# Patient Record
Sex: Female | Born: 1950 | Race: Black or African American | Hispanic: No | Marital: Married | Smoking: Never smoker
Health system: Southern US, Community
[De-identification: ages and names within clinical notes are randomized; demographics above are authoritative.]

## PROBLEM LIST (undated history)

## (undated) DIAGNOSIS — E119 Type 2 diabetes mellitus without complications: Secondary | ICD-10-CM

## (undated) DIAGNOSIS — E785 Hyperlipidemia, unspecified: Secondary | ICD-10-CM

## (undated) DIAGNOSIS — I10 Essential (primary) hypertension: Secondary | ICD-10-CM

## (undated) HISTORY — PX: TUBAL LIGATION: SHX77

---

## 2000-09-09 ENCOUNTER — Emergency Department (HOSPITAL_COMMUNITY): Admission: EM | Admit: 2000-09-09 | Discharge: 2000-09-09 | Payer: Self-pay | Admitting: Emergency Medicine

## 2013-04-10 ENCOUNTER — Emergency Department (HOSPITAL_BASED_OUTPATIENT_CLINIC_OR_DEPARTMENT_OTHER): Payer: BC Managed Care – PPO

## 2013-04-10 ENCOUNTER — Emergency Department (HOSPITAL_BASED_OUTPATIENT_CLINIC_OR_DEPARTMENT_OTHER)
Admission: EM | Admit: 2013-04-10 | Discharge: 2013-04-10 | Disposition: A | Discharge status: Home / Self Care | Payer: BC Managed Care – PPO | Attending: Emergency Medicine | Admitting: Emergency Medicine

## 2013-04-10 ENCOUNTER — Encounter (HOSPITAL_BASED_OUTPATIENT_CLINIC_OR_DEPARTMENT_OTHER): Payer: Self-pay | Admitting: *Deleted

## 2013-04-10 DIAGNOSIS — M7660 Achilles tendinitis, unspecified leg: Secondary | ICD-10-CM | POA: Insufficient documentation

## 2013-04-10 DIAGNOSIS — Y9239 Other specified sports and athletic area as the place of occurrence of the external cause: Secondary | ICD-10-CM | POA: Insufficient documentation

## 2013-04-10 DIAGNOSIS — Z79899 Other long term (current) drug therapy: Secondary | ICD-10-CM | POA: Insufficient documentation

## 2013-04-10 DIAGNOSIS — Z880 Allergy status to penicillin: Secondary | ICD-10-CM | POA: Insufficient documentation

## 2013-04-10 DIAGNOSIS — E119 Type 2 diabetes mellitus without complications: Secondary | ICD-10-CM | POA: Insufficient documentation

## 2013-04-10 DIAGNOSIS — X503XXA Overexertion from repetitive movements, initial encounter: Secondary | ICD-10-CM | POA: Insufficient documentation

## 2013-04-10 DIAGNOSIS — Y9341 Activity, dancing: Secondary | ICD-10-CM | POA: Insufficient documentation

## 2013-04-10 DIAGNOSIS — M654 Radial styloid tenosynovitis [de Quervain]: Secondary | ICD-10-CM | POA: Insufficient documentation

## 2013-04-10 DIAGNOSIS — M7662 Achilles tendinitis, left leg: Secondary | ICD-10-CM

## 2013-04-10 DIAGNOSIS — Y92838 Other recreation area as the place of occurrence of the external cause: Secondary | ICD-10-CM | POA: Insufficient documentation

## 2013-04-10 DIAGNOSIS — Z862 Personal history of diseases of the blood and blood-forming organs and certain disorders involving the immune mechanism: Secondary | ICD-10-CM | POA: Insufficient documentation

## 2013-04-10 DIAGNOSIS — Z794 Long term (current) use of insulin: Secondary | ICD-10-CM | POA: Insufficient documentation

## 2013-04-10 DIAGNOSIS — Z8639 Personal history of other endocrine, nutritional and metabolic disease: Secondary | ICD-10-CM | POA: Insufficient documentation

## 2013-04-10 DIAGNOSIS — I10 Essential (primary) hypertension: Secondary | ICD-10-CM | POA: Insufficient documentation

## 2013-04-10 HISTORY — DX: Hyperlipidemia, unspecified: E78.5

## 2013-04-10 HISTORY — DX: Essential (primary) hypertension: I10

## 2013-04-10 HISTORY — DX: Type 2 diabetes mellitus without complications: E11.9

## 2013-04-10 MED ORDER — HYDROCODONE-ACETAMINOPHEN 5-325 MG PO TABS: 1.0000 | ORAL_TABLET | Freq: Four times a day (QID) | ORAL | Status: DC | PRN

## 2013-04-10 MED ORDER — NAPROXEN 500 MG PO TABS: 500.0000 mg | ORAL_TABLET | Freq: Two times a day (BID) | ORAL | Status: DC

## 2013-04-10 NOTE — ED Provider Notes (Addendum)
History    CSN: [id number] Arrival date & time 04/10/13  6962  First MD Initiated Contact with Patient 04/10/13 1902     Chief Complaint  Patient presents with  . Foot Injury   (Consider location/radiation/quality/duration/timing/severity/associated sxs/prior Treatment) HPI Comments: Started having pain in the achilles after a zumba class.  Since then sx are worsening and now shooting up to her calf.  No trauma  Patient is a 62 y.o. female presenting with foot injury. The history is provided by the patient.  Foot Injury Location:  Foot Time since incident:  6 days Injury: no   Foot location:  L foot Pain details:    Quality:  Sharp, shooting and throbbing   Radiates to:  L leg   Severity:  Moderate   Onset quality:  Gradual   Timing:  Constant   Progression:  Worsening Chronicity:  New Prior injury to area:  No Relieved by:  Rest Worsened by:  Bearing weight Ineffective treatments:  NSAIDs Associated symptoms: no fever, no muscle weakness and no swelling    Past Medical History  Diagnosis Date  . Diabetes mellitus without complication   . Hypertension   . Hyperlipemia    Past Surgical History  Procedure Laterality Date  . Cesarean section    . Tubal ligation     History reviewed. No pertinent family history. History  Substance Use Topics  . Smoking status: Never Smoker   . Smokeless tobacco: Not on file  . Alcohol Use: No   OB History   Grav Para Term Preterm Abortions TAB SAB Ect Mult Living                 Review of Systems  Constitutional: Negative for fever.  All other systems reviewed and are negative.    Allergies  Penicillins  Home Medications   Current Outpatient Rx  Name  Route  Sig  Dispense  Refill  . amLODipine (NORVASC) 5 MG tablet   Oral   Take 5 mg by mouth daily.         . hydrochlorothiazide (MICROZIDE) 12.5 MG capsule   Oral   Take 12.5 mg by mouth daily.         . insulin glargine (LANTUS) 100 UNIT/ML injection  Subcutaneous   Inject 45 Units into the skin 2 (two) times daily.          BP 182/70  Pulse 78  Temp(Src) 98.8 F (37.1 C)  Resp 16  Ht 5\' 5"  (1.651 m)  Wt 180 lb (81.647 kg)  BMI 29.95 kg/m2  SpO2 100% Physical Exam  Nursing note and vitals reviewed. Constitutional: She is oriented to person, place, and time. She appears well-developed and well-nourished. No distress.  HENT:  Head: Normocephalic and atraumatic.  Eyes: EOM are normal. Pupils are equal, round, and reactive to light.  Cardiovascular: Normal rate and intact distal pulses.   Pulmonary/Chest: Effort normal.  Musculoskeletal:       Left ankle: Achilles tendon exhibits pain. Achilles tendon exhibits no defect and normal Thompson's test results.       Left foot: She exhibits tenderness and swelling. She exhibits no bony tenderness.       Feet:  Mld left calf pain.  Bilateral 1+ pitting edema in the ankle  Neurological: She is alert and oriented to person, place, and time.  Skin: Skin is warm and dry.  Psychiatric: She has a normal mood and affect. Her behavior is normal.    ED Course  Procedures (including critical care time) Labs Reviewed - No data to display US Venous Img Lower Unilateral Left  04/10/2013   *RADIOLOGY REPORT*  Clinical Data: Left foot pain and edema, history of prior DVT  LEFT LOWER EXTREMITY VENOUS DUPLEX ULTRASOUND  Technique:  Gray-scale sonography with graded compression, as well as color Doppler and duplex ultrasound were performed to evaluate the deep venous system of the lower extremity from the level of the common femoral vein through the popliteal and proximal calf veins. Spectral Doppler was utilized to evaluate flow at rest and with distal augmentation maneuvers.  Comparison:  None.  Findings:  Normal compressibility of the common femoral, superficial femoral, and popliteal veins is demonstrated, as well as the visualized proximal calf veins.  No filling defects to suggest DVT on grayscale  or color Doppler imaging.  Doppler waveforms show normal direction of venous flow, normal respiratory phasicity and response to augmentation. Great saphenous vein is patent and compressible.  No superficial thrombophlebitis.  IMPRESSION: No evidence of lower extremity deep vein thrombosis.   Original Report Authenticated By: Malachy Moan, M.D.   1. Achilles tendinitis, left     MDM    Patient presenting due to 6 days of worsening left at least tenderness with pain shooting up into her left calf. She states it started after an exercise group. However she has a prior history of TTP was concern for a blood clot. She denies any prolonged immobilization is currently not on any blood thinners.  Patient has pain and mild swelling over the left at least tendon. She has no sign of tendon rupture and feel most likely she has tendinitis. However given her history we'll do an ultrasound to rule out clot. No history or findings concerning for fracture.  U/s neg for DVT.  Pt given pain control and anti-inflammatory. Prior to d/c pt also c/o of right thumb and wrist pain that has been off and on for awhile and suggest de Quervain's tenosynovitis. Patient's types all day at work in pain is consistent with this. Patient placed in a Velcro wrist splint for comfort and to followup with above recommendations.  Gwyneth Sprout, MD 04/10/13 3086  Gwyneth Sprout, MD 04/16/13 1530

## 2013-04-10 NOTE — ED Notes (Signed)
Pt c/o left foot injury x 6 days ago

## 2013-04-10 NOTE — ED Notes (Signed)
Patient back from Ultrasound.

## 2013-09-19 ENCOUNTER — Encounter (HOSPITAL_BASED_OUTPATIENT_CLINIC_OR_DEPARTMENT_OTHER): Payer: Self-pay | Admitting: Emergency Medicine

## 2013-09-19 ENCOUNTER — Emergency Department (HOSPITAL_BASED_OUTPATIENT_CLINIC_OR_DEPARTMENT_OTHER): Payer: BC Managed Care – PPO

## 2013-09-19 ENCOUNTER — Emergency Department (HOSPITAL_BASED_OUTPATIENT_CLINIC_OR_DEPARTMENT_OTHER)
Admission: EM | Admit: 2013-09-19 | Discharge: 2013-09-19 | Disposition: A | Discharge status: Home / Self Care | Payer: BC Managed Care – PPO | Attending: Emergency Medicine | Admitting: Emergency Medicine

## 2013-09-19 DIAGNOSIS — Z79899 Other long term (current) drug therapy: Secondary | ICD-10-CM | POA: Insufficient documentation

## 2013-09-19 DIAGNOSIS — Z794 Long term (current) use of insulin: Secondary | ICD-10-CM | POA: Insufficient documentation

## 2013-09-19 DIAGNOSIS — R079 Chest pain, unspecified: Secondary | ICD-10-CM

## 2013-09-19 DIAGNOSIS — K219 Gastro-esophageal reflux disease without esophagitis: Secondary | ICD-10-CM | POA: Insufficient documentation

## 2013-09-19 DIAGNOSIS — I10 Essential (primary) hypertension: Secondary | ICD-10-CM | POA: Insufficient documentation

## 2013-09-19 DIAGNOSIS — Z791 Long term (current) use of non-steroidal anti-inflammatories (NSAID): Secondary | ICD-10-CM | POA: Insufficient documentation

## 2013-09-19 DIAGNOSIS — E119 Type 2 diabetes mellitus without complications: Secondary | ICD-10-CM | POA: Insufficient documentation

## 2013-09-19 LAB — COMPREHENSIVE METABOLIC PANEL
AST: 16 U/L (ref 0–37)
BUN: 25 mg/dL — ABNORMAL HIGH (ref 6–23)
CO2: 27 mEq/L (ref 19–32)
Calcium: 9.8 mg/dL (ref 8.4–10.5)
Creatinine, Ser: 1.2 mg/dL — ABNORMAL HIGH (ref 0.50–1.10)
GFR calc non Af Amer: 47 mL/min — ABNORMAL LOW (ref >90)

## 2013-09-19 LAB — CBC WITH DIFFERENTIAL/PLATELET
Basophils Absolute: 0 10*3/uL (ref 0.0–0.1)
Basophils Relative: 0 % (ref 0–1)
Eosinophils Relative: 2 % (ref 0–5)
HCT: 35.7 % — ABNORMAL LOW (ref 36.0–46.0)
Lymphocytes Relative: 39 % (ref 12–46)
MCHC: 33.3 g/dL (ref 30.0–36.0)
MCV: 84.2 fL (ref 78.0–100.0)
Monocytes Absolute: 0.4 10*3/uL (ref 0.1–1.0)
Monocytes Relative: 6 % (ref 3–12)
RDW: 12.9 % (ref 11.5–15.5)

## 2013-09-19 LAB — TROPONIN I: Troponin I: <0.3 ng/mL (ref <0.30)

## 2013-09-19 LAB — LIPASE, BLOOD: Lipase: 47 U/L (ref 11–59)

## 2013-09-19 MED ORDER — ONDANSETRON HCL 4 MG/2ML IJ SOLN
4.0000 mg | Freq: Once | INTRAMUSCULAR | Status: AC
  Administered 2013-09-19: 4 mg via INTRAVENOUS
  Filled 2013-09-19: qty 2

## 2013-09-19 MED ORDER — PANTOPRAZOLE SODIUM 20 MG PO TBEC: 20.0000 mg | DELAYED_RELEASE_TABLET | Freq: Every day | ORAL | Status: DC

## 2013-09-19 MED ORDER — PANTOPRAZOLE SODIUM 40 MG IV SOLR
40.0000 mg | Freq: Once | INTRAVENOUS | Status: AC
  Administered 2013-09-19: 40 mg via INTRAVENOUS
  Filled 2013-09-19: qty 40

## 2013-09-19 MED ORDER — HYDROCODONE-ACETAMINOPHEN 5-325 MG PO TABS: 1.0000 | ORAL_TABLET | ORAL | Status: DC | PRN

## 2013-09-19 MED ORDER — SODIUM CHLORIDE 0.9 % IV BOLUS (SEPSIS)
500.0000 mL | Freq: Once | INTRAVENOUS | Status: AC
  Administered 2013-09-19: 500 mL via INTRAVENOUS

## 2013-09-19 MED ORDER — MORPHINE SULFATE 4 MG/ML IJ SOLN
4.0000 mg | Freq: Once | INTRAMUSCULAR | Status: AC
  Administered 2013-09-19: 4 mg via INTRAVENOUS
  Filled 2013-09-19: qty 1

## 2013-09-19 MED ORDER — GI COCKTAIL ~~LOC~~
30.0000 mL | Freq: Once | ORAL | Status: AC
  Administered 2013-09-19: 30 mL via ORAL
  Filled 2013-09-19: qty 30

## 2013-09-19 NOTE — ED Provider Notes (Signed)
Medical screening examination/treatment/procedure(s) were performed by non-physician practitioner and as supervising physician I was immediately available for consultation/collaboration.  EKG Interpretation    Date/Time:  Friday September 19 2013 17:58:36 EST Ventricular Rate:  71 PR Interval:  156 QRS Duration: 88 QT Interval:  382 QTC Calculation: 415 R Axis:   36 Text Interpretation:  Normal sinus rhythm Nonspecific T wave abnormality Abnormal ECG since last tracing no significant change Confirmed by Violia Knopf  MD, Allyiah Gartner ([id number]) on 09/19/2013 6:06:51 PM              Rolan Bucco, MD 09/19/13 2240

## 2013-09-19 NOTE — ED Notes (Signed)
Pt. Reports she has an ulcer and this pain feels like the ulcer pain she has had in past.

## 2013-09-19 NOTE — ED Notes (Signed)
Chest pain x 3 days. Sternal pain that feels like heartburn. No relief with Zantac.

## 2013-09-19 NOTE — ED Notes (Signed)
Pt. Reports she is taking PCN for a tooth abcess at present time.

## 2013-09-19 NOTE — ED Notes (Signed)
Family at bedside. 

## 2013-09-19 NOTE — ED Provider Notes (Signed)
CSN: [id number]     Arrival date & time 09/19/13  1751 History   None    Chief Complaint  Patient presents with  . Chest Pain   (Consider location/radiation/quality/duration/timing/severity/associated sxs/prior Treatment) HPI Comments: Pt states that she has had chest pain/epigastric pain times 3 days:no sob, diaphoresis:vomiting at time of onset:no fever, cough:pt states that she has a history of ulcer and it feels similar but zantac was not helping:pt state that she had a unremarkable cath in 2011:nothing makes the pain better or worse  The history is provided by the patient. No language interpreter was used.    Past Medical History  Diagnosis Date  . Diabetes mellitus without complication   . Hypertension   . Hyperlipemia    Past Surgical History  Procedure Laterality Date  . Cesarean section    . Tubal ligation     No family history on file. History  Substance Use Topics  . Smoking status: Never Smoker   . Smokeless tobacco: Not on file  . Alcohol Use: No   OB History   Grav Para Term Preterm Abortions TAB SAB Ect Mult Living                 Review of Systems  Constitutional: Negative.   Respiratory: Negative.  Negative for shortness of breath.   Cardiovascular: Positive for chest pain.    Allergies  Review of patient's allergies indicates no active allergies.  Home Medications   Current Outpatient Rx  Name  Route  Sig  Dispense  Refill  . PENICILLIN V POTASSIUM PO   Oral   Take by mouth.         Marland Kitchen amLODipine (NORVASC) 5 MG tablet   Oral   Take 5 mg by mouth daily.         . hydrochlorothiazide (MICROZIDE) 12.5 MG capsule   Oral   Take 12.5 mg by mouth daily.         Marland Kitchen HYDROcodone-acetaminophen (NORCO/VICODIN) 5-325 MG per tablet   Oral   Take 1 tablet by mouth every 6 (six) hours as needed for pain.   15 tablet   0   . insulin glargine (LANTUS) 100 UNIT/ML injection   Subcutaneous   Inject 45 Units into the skin 2 (two) times daily.          . naproxen (NAPROSYN) 500 MG tablet   Oral   Take 1 tablet (500 mg total) by mouth 2 (two) times daily.   30 tablet   0    BP 186/86  Pulse 73  Temp(Src) 98.6 F (37 C) (Oral)  Resp 18  Ht 5' 5.5" (1.664 m)  Wt 180 lb (81.647 kg)  BMI 29.49 kg/m2  SpO2 100% Physical Exam  Nursing note and vitals reviewed. Constitutional: She is oriented to person, place, and time. She appears well-developed and well-nourished.  HENT:  Head: Normocephalic and atraumatic.  Eyes: Conjunctivae and EOM are normal.  Cardiovascular: Normal rate and regular rhythm.   Pulmonary/Chest: Effort normal and breath sounds normal.  Abdominal: Soft. Bowel sounds are normal. There is tenderness in the epigastric area.  Musculoskeletal: Normal range of motion.  Neurological: She is alert and oriented to person, place, and time.  Skin: Skin is warm and dry.    ED Course  Procedures (including critical care time) Labs Review Labs Reviewed  CBC WITH DIFFERENTIAL - Abnormal; Notable for the following:    Hemoglobin 11.9 (*)    HCT 35.7 (*)  All other components within normal limits  COMPREHENSIVE METABOLIC PANEL - Abnormal; Notable for the following:    Glucose, Bld 112 (*)    BUN 25 (*)    Creatinine, Ser 1.20 (*)    GFR calc non Af Amer 47 (*)    GFR calc Af Amer 55 (*)    All other components within normal limits  TROPONIN I  LIPASE, BLOOD   Imaging Review Dg Chest 2 View  09/19/2013   CLINICAL DATA:  Chest pain for 3 days.  EXAM: CHEST  2 VIEW  COMPARISON:  None.  FINDINGS: The lungs are well-aerated and clear. There is no evidence of focal opacification, pleural effusion or pneumothorax.  The heart is normal in size; the mediastinal contour is within normal limits. No acute osseous abnormalities are seen.  IMPRESSION: No acute cardiopulmonary process seen.   Electronically Signed   By: Roanna Raider M.D.   On: 09/19/2013 21:39    EKG Interpretation    Date/Time:  Friday September 19 2013 17:58:36 EST Ventricular Rate:  71 PR Interval:  156 QRS Duration: 88 QT Interval:  382 QTC Calculation: 415 R Axis:   36 Text Interpretation:  Normal sinus rhythm Nonspecific T wave abnormality Abnormal ECG since last tracing no significant change Confirmed by BELFI  MD, MELANIE ([id number]) on 09/19/2013 6:06:51 PM            MDM   1. GERD (gastroesophageal reflux disease)   2. Chest pain    Pt symptoms have resolved:obtained [facility]-no significant disease noted, it was done 03/2010:doubt acs:pt state that she started victoza recently and that seems about when the symptoms started:discussed with pt that it may be a side effect:pt to follow up with pcp; sent home on protonix an vicodin    Teressa Lower, NP 09/19/13 2200

## 2013-09-20 ENCOUNTER — Emergency Department (HOSPITAL_BASED_OUTPATIENT_CLINIC_OR_DEPARTMENT_OTHER)
Admission: RE | Admit: 2013-09-20 | Discharge: 2013-09-20 | Disposition: A | Discharge status: Home / Self Care | Payer: BC Managed Care – PPO | Source: Ambulatory Visit | Attending: Emergency Medicine | Admitting: Emergency Medicine

## 2013-09-20 ENCOUNTER — Other Ambulatory Visit (HOSPITAL_BASED_OUTPATIENT_CLINIC_OR_DEPARTMENT_OTHER): Payer: Self-pay | Admitting: Emergency Medicine

## 2013-09-20 ENCOUNTER — Emergency Department (HOSPITAL_BASED_OUTPATIENT_CLINIC_OR_DEPARTMENT_OTHER): Admission: RE | Admit: 2013-09-20 | Payer: BC Managed Care – PPO | Source: Ambulatory Visit

## 2013-09-20 DIAGNOSIS — R079 Chest pain, unspecified: Secondary | ICD-10-CM

## 2014-11-04 ENCOUNTER — Encounter (HOSPITAL_BASED_OUTPATIENT_CLINIC_OR_DEPARTMENT_OTHER): Payer: Self-pay | Admitting: *Deleted

## 2014-11-04 ENCOUNTER — Emergency Department (HOSPITAL_BASED_OUTPATIENT_CLINIC_OR_DEPARTMENT_OTHER)
Admission: EM | Admit: 2014-11-04 | Discharge: 2014-11-04 | Disposition: A | Discharge status: Home / Self Care | Payer: BLUE CROSS/BLUE SHIELD | Attending: Emergency Medicine | Admitting: Emergency Medicine

## 2014-11-04 DIAGNOSIS — E785 Hyperlipidemia, unspecified: Secondary | ICD-10-CM | POA: Insufficient documentation

## 2014-11-04 DIAGNOSIS — S39012A Strain of muscle, fascia and tendon of lower back, initial encounter: Secondary | ICD-10-CM

## 2014-11-04 DIAGNOSIS — X58XXXA Exposure to other specified factors, initial encounter: Secondary | ICD-10-CM | POA: Diagnosis not present

## 2014-11-04 DIAGNOSIS — Z794 Long term (current) use of insulin: Secondary | ICD-10-CM | POA: Insufficient documentation

## 2014-11-04 DIAGNOSIS — Z79899 Other long term (current) drug therapy: Secondary | ICD-10-CM | POA: Diagnosis not present

## 2014-11-04 DIAGNOSIS — I10 Essential (primary) hypertension: Secondary | ICD-10-CM | POA: Insufficient documentation

## 2014-11-04 DIAGNOSIS — Y9389 Activity, other specified: Secondary | ICD-10-CM | POA: Insufficient documentation

## 2014-11-04 DIAGNOSIS — Y9289 Other specified places as the place of occurrence of the external cause: Secondary | ICD-10-CM | POA: Insufficient documentation

## 2014-11-04 DIAGNOSIS — Z791 Long term (current) use of non-steroidal anti-inflammatories (NSAID): Secondary | ICD-10-CM | POA: Insufficient documentation

## 2014-11-04 DIAGNOSIS — E119 Type 2 diabetes mellitus without complications: Secondary | ICD-10-CM | POA: Diagnosis not present

## 2014-11-04 DIAGNOSIS — S3992XA Unspecified injury of lower back, initial encounter: Secondary | ICD-10-CM | POA: Diagnosis present

## 2014-11-04 DIAGNOSIS — Y998 Other external cause status: Secondary | ICD-10-CM | POA: Insufficient documentation

## 2014-11-04 MED ORDER — CYCLOBENZAPRINE HCL 5 MG PO TABS: 5.0000 mg | ORAL_TABLET | Freq: Two times a day (BID) | ORAL | Status: DC | PRN

## 2014-11-04 MED ORDER — AMLODIPINE BESYLATE 10 MG PO TABS: 5.0000 mg | ORAL_TABLET | Freq: Every day | ORAL | Status: AC

## 2014-11-04 MED ORDER — LISINOPRIL 20 MG PO TABS: 20.0000 mg | ORAL_TABLET | Freq: Every day | ORAL | Status: DC

## 2014-11-04 MED ORDER — OXYCODONE HCL 5 MG PO TABS: 2.5000 mg | ORAL_TABLET | Freq: Four times a day (QID) | ORAL | Status: DC | PRN

## 2014-11-04 NOTE — ED Provider Notes (Signed)
CSN: [id number]     Arrival date & time 11/04/14  1034 History   First MD Initiated Contact with Patient 11/04/14 1203     Chief Complaint  Patient presents with  . Back Pain     (Consider location/radiation/quality/duration/timing/severity/associated sxs/prior Treatment) HPI  Pt presents to ED today c/o back pain x2 days. She states the day prior to onset she was taking down her Christmas tree with moderate lifting and bending. The pain is in the lower right back. She describes the pain as throbbing. The pain worsens with movement and laying supine, it is improved by sitting. She took naproxen and Tylenol with some relief, from 10/10 to 8/10. She denies any paresthesias, radiation to legs or upper back, loss of control of bowel or bladder, loss of balance, or gait problems. The patient also reports having been out of refills for amlodipine and Lisinopril x1wk due to issue with PCP and pharmacy. She endorses taking all other medications as prescribed.  Past Medical History  Diagnosis Date  . Diabetes mellitus without complication   . Hypertension   . Hyperlipemia    Past Surgical History  Procedure Laterality Date  . Cesarean section    . Tubal ligation     No family history on file. History  Substance Use Topics  . Smoking status: Never Smoker   . Smokeless tobacco: Not on file  . Alcohol Use: No   OB History    No data available     Review of Systems  10 Systems reviewed and are negative for acute change except as noted in the HPI.     Allergies  Codeine  Home Medications   Prior to Admission medications   Medication Sig Start Date End Date Taking? Authorizing Provider  atorvastatin (LIPITOR) 20 MG tablet Take 20 mg by mouth daily.   Yes Historical Provider, MD  insulin aspart (NOVOLOG) 100 UNIT/ML injection Inject into the skin 3 (three) times daily before meals.   Yes Historical Provider, MD  lisinopril (PRINIVIL,ZESTRIL) 20 MG tablet Take 20 mg by mouth daily.    Yes Historical Provider, MD  niacin 100 MG tablet Take 100 mg by mouth at bedtime.   Yes Historical Provider, MD  amLODipine (NORVASC) 10 MG tablet Take 0.5 tablets (5 mg total) by mouth daily. 11/04/14   Josedejesus Marcum Irine Seal, PA-C  amLODipine (NORVASC) 5 MG tablet Take 5 mg by mouth daily.    Historical Provider, MD  cyclobenzaprine (FLEXERIL) 5 MG tablet Take 1 tablet (5 mg total) by mouth 2 (two) times daily as needed for muscle spasms. 11/04/14   Moriyah Byington Irine Seal, PA-C  hydrochlorothiazide (MICROZIDE) 12.5 MG capsule Take 12.5 mg by mouth daily.    Historical Provider, MD  HYDROcodone-acetaminophen (NORCO/VICODIN) 5-325 MG per tablet Take 1 tablet by mouth every 6 (six) hours as needed for pain. 04/10/13   Gwyneth Sprout, MD  HYDROcodone-acetaminophen (NORCO/VICODIN) 5-325 MG per tablet Take 1-2 tablets by mouth every 4 (four) hours as needed. 09/19/13   Teressa Lower, NP  insulin glargine (LANTUS) 100 UNIT/ML injection Inject 45 Units into the skin 2 (two) times daily.    Historical Provider, MD  lisinopril (PRINIVIL,ZESTRIL) 20 MG tablet Take 1 tablet (20 mg total) by mouth daily. 11/04/14   Brigitta Pricer Irine Seal, PA-C  naproxen (NAPROSYN) 500 MG tablet Take 1 tablet (500 mg total) by mouth 2 (two) times daily. 04/10/13   Gwyneth Sprout, MD  oxycodone (OXY IR/ROXICODONE) 5 MG immediate release tablet Take 0.5-1 tablets (2.5-5  mg total) by mouth every 6 (six) hours as needed for pain. 11/04/14   Kalianna Verbeke Irine SealG Preslea Rhodus, PA-C  pantoprazole (PROTONIX) 20 MG tablet Take 1 tablet (20 mg total) by mouth daily. 09/19/13   Teressa LowerVrinda Pickering, NP  PENICILLIN V POTASSIUM PO Take by mouth.    Historical Provider, MD   BP 192/71 mmHg  Pulse 97  Temp(Src) 98.2 F (36.8 C) (Oral)  Resp 18  Ht 5' 5.5" (1.664 m)  Wt 185 lb (83.915 kg)  BMI 30.31 kg/m2  SpO2 100% Physical Exam  Constitutional: She appears well-developed and well-nourished. No distress.  HENT:  Head: Normocephalic and atraumatic.  Eyes: Pupils are  equal, round, and reactive to light.  Neck: Normal range of motion. Neck supple.  Cardiovascular: Normal rate and regular rhythm.   Pulmonary/Chest: Effort normal.  Abdominal: Soft.  Musculoskeletal:       Back:  Pt has equal strength to bilateral lower extremities.  Neurosensory function adequate to both legs No clonus on dorsiflextion Skin color is normal. Skin is warm and moist.  I see no step off deformity, no midline bony tenderness.  Pt is able to ambulate.  No crepitus, laceration, effusion, induration, lesions, swelling.   Pedal pulses are symmetrical and palpable bilaterally  moderate tenderness to palpation of paraspinel muscles   Neurological: She is alert.  Skin: Skin is warm and dry.  Nursing note and vitals reviewed.   ED Course  Procedures (including critical care time) Labs Review Labs Reviewed - No data to display  Imaging Review No results found.   EKG Interpretation None      MDM   Final diagnoses:  Low back strain, initial encounter   Pt advised not to use NSAIDs oxycodone (OXY IR/ROXICODONE) 5 MG immediate release tablet Take 0.5-1 tablets (2.5-5 mg total) by mouth every 6 (six) hours as needed for pain. 6 tablet Dorthula Matasiffany G Dawnisha Marquina, PA-C    lisinopril (PRINIVIL,ZESTRIL) 20 MG tablet Take 1 tablet (20 mg total) by mouth daily. 8 tablet Dorthula Matasiffany G Jamekia Gannett, PA-C     64 y.o.Kiara Barrett's  with back pain. No neurological deficits and normal neuro exam. Patient can walk. No loss of bowel or bladder control. No concern for cauda equina at this time base on HPI and physical exam findings. No fever, night sweats, weight loss, h/o cancer, IVDU.   RICE protocol and pain medicine indicated and discussed with patient.   Patient Plan 1. Medications: pain medication and muscle relaxer. Cont usual home medications unless otherwise directed. 2. Treatment: rest, drink plenty of fluids, gentle stretching as discussed, alternate ice and heat  3. Follow Up:  Please followup with your primary doctor for discussion of your diagnoses and further evaluation after today's visit; if you do not have a primary care doctor use the resource guide provided to find one  Advised to follow-up with the orthopedist if symptoms do not start to resolve in the next 2-3 days. If develop loss of bowel or urinary control return to the ED as soon as possible for further evaluation. To take the medications as prescribed as they can cause harm if not taken appropriately.   Vital signs are stable at discharge. Filed Vitals:   11/04/14 1042  BP: 192/71  Pulse: 97  Temp: 98.2 F (36.8 C)  Resp: 18    Patient/guardian has voiced understanding and agreed to follow-up with the PCP or specialist.         Dorthula Matasiffany G Mikell Camp, PA-C 11/04/14 1311  Rolan BuccoMelanie Belfi,  MD 11/04/14 1433

## 2014-11-04 NOTE — ED Notes (Addendum)
Pt reports trying heat on the back and taking tylenol and naproxen but no relief.   Reports only taking 1 of the antihypertensive meds this morning. She is out of the amlodipine.

## 2014-11-04 NOTE — ED Notes (Signed)
PA at bedside.

## 2014-11-04 NOTE — Discharge Instructions (Signed)
Muscle Strain °A muscle strain is an injury that occurs when a muscle is stretched beyond its normal length. Usually a small number of muscle fibers are torn when this happens. Muscle strain is rated in degrees. First-degree strains have the least amount of muscle fiber tearing and pain. Second-degree and third-degree strains have increasingly more tearing and pain.  °Usually, recovery from muscle strain takes 1-2 weeks. Complete healing takes 5-6 weeks.  °CAUSES  °Muscle strain happens when a sudden, violent force placed on a muscle stretches it too far. This may occur with lifting, sports, or a fall.  °RISK FACTORS °Muscle strain is especially common in athletes.  °SIGNS AND SYMPTOMS °At the site of the muscle strain, there may be: °· Pain. °· Bruising. °· Swelling. °· Difficulty using the muscle due to pain or lack of normal function. °DIAGNOSIS  °Your health care provider will perform a physical exam and ask about your medical history. °TREATMENT  °Often, the best treatment for a muscle strain is resting, icing, and applying cold compresses to the injured area.   °HOME CARE INSTRUCTIONS  °· Use the PRICE method of treatment to promote muscle healing during the first 2-3 days after your injury. The PRICE method involves: °¨ Protecting the muscle from being injured again. °¨ Restricting your activity and resting the injured body part. °¨ Icing your injury. To do this, put ice in a plastic bag. Place a towel between your skin and the bag. Then, apply the ice and leave it on from 15-20 minutes each hour. After the third day, switch to moist heat packs. °¨ Apply compression to the injured area with a splint or elastic bandage. Be careful not to wrap it too tightly. This may interfere with blood circulation or increase swelling. °¨ Elevate the injured body part above the level of your heart as often as you can. °· Only take over-the-counter or prescription medicines for pain, discomfort, or fever as directed by your  health care provider. °· Warming up prior to exercise helps to prevent future muscle strains. °SEEK MEDICAL CARE IF:  °· You have increasing pain or swelling in the injured area. °· You have numbness, tingling, or a significant loss of strength in the injured area. °MAKE SURE YOU:  °· Understand these instructions. °· Will watch your condition. °· Will get help right away if you are not doing well or get worse. °Document Released: 10/02/2005 Document Revised: 07/23/2013 Document Reviewed: 05/01/2013 °[employer]® Patient Information ©2015 [employer]. This information is not intended to replace advice given to you by your health care provider. Make sure you discuss any questions you have with your health care provider. ° °Lumbosacral Strain °Lumbosacral strain is a strain of any of the parts that make up your lumbosacral vertebrae. Your lumbosacral vertebrae are the bones that make up the lower third of your backbone. Your lumbosacral vertebrae are held together by muscles and tough, fibrous tissue (ligaments).  °CAUSES  °A sudden blow to your back can cause lumbosacral strain. Also, anything that causes an excessive stretch of the muscles in the low back can cause this strain. This is typically seen when people exert themselves strenuously, fall, lift heavy objects, bend, or crouch repeatedly. °RISK FACTORS °· Physically demanding work. °· Participation in pushing or pulling sports or sports that require a sudden twist of the back (tennis, golf, baseball). °· Weight lifting. °· Excessive lower back curvature. °· Forward-tilted pelvis. °· Weak back or abdominal muscles or both. °· Tight hamstrings. °SIGNS AND SYMPTOMS  °  Lumbosacral strain may cause pain in the area of your injury or pain that moves (radiates) down your leg.  DIAGNOSIS Your health care provider can often diagnose lumbosacral strain through a physical exam. In some cases, you may need tests such as X-ray exams.  TREATMENT  Treatment for your lower  back injury depends on many factors that your clinician will have to evaluate. However, most treatment will include the use of anti-inflammatory medicines. HOME CARE INSTRUCTIONS   Avoid hard physical activities (tennis, racquetball, waterskiing) if you are not in proper physical condition for it. This may aggravate or create problems.  If you have a back problem, avoid sports requiring sudden body movements. Swimming and walking are generally safer activities.  Maintain good posture.  Maintain a healthy weight.  For acute conditions, you may put ice on the injured area.  Put ice in a plastic bag.  Place a towel between your skin and the bag.  Leave the ice on for 20 minutes, 2-3 times a day.  When the low back starts healing, stretching and strengthening exercises may be recommended. SEEK MEDICAL CARE IF:  Your back pain is getting worse.  You experience severe back pain not relieved with medicines. SEEK IMMEDIATE MEDICAL CARE IF:   You have numbness, tingling, weakness, or problems with the use of your arms or legs.  There is a change in bowel or bladder control.  You have increasing pain in any area of the body, including your belly (abdomen).  You notice shortness of breath, dizziness, or feel faint.  You feel sick to your stomach (nauseous), are throwing up (vomiting), or become sweaty.  You notice discoloration of your toes or legs, or your feet get very cold. MAKE SURE YOU:   Understand these instructions.  Will watch your condition.  Will get help right away if you are not doing well or get worse. Document Released: 07/12/2005 Document Revised: 10/07/2013 Document Reviewed: 05/21/2013 [facility] Patient Information 2015 [employer] [place]. This information is not intended to replace advice given to you by your health care provider. Make sure you discuss any questions you have with your health care provider.

## 2014-11-04 NOTE — ED Notes (Signed)
Pt c/o lower back pain with right side hurting worse than the left. Pain is worse at night.

## 2015-10-04 ENCOUNTER — Emergency Department (HOSPITAL_BASED_OUTPATIENT_CLINIC_OR_DEPARTMENT_OTHER): Payer: BLUE CROSS/BLUE SHIELD

## 2015-10-04 ENCOUNTER — Emergency Department (HOSPITAL_BASED_OUTPATIENT_CLINIC_OR_DEPARTMENT_OTHER)
Admission: EM | Admit: 2015-10-04 | Discharge: 2015-10-04 | Disposition: A | Discharge status: Home / Self Care | Payer: BLUE CROSS/BLUE SHIELD | Attending: Emergency Medicine | Admitting: Emergency Medicine

## 2015-10-04 ENCOUNTER — Encounter (HOSPITAL_BASED_OUTPATIENT_CLINIC_OR_DEPARTMENT_OTHER): Payer: Self-pay | Admitting: Emergency Medicine

## 2015-10-04 DIAGNOSIS — I10 Essential (primary) hypertension: Secondary | ICD-10-CM | POA: Insufficient documentation

## 2015-10-04 DIAGNOSIS — M25562 Pain in left knee: Secondary | ICD-10-CM | POA: Insufficient documentation

## 2015-10-04 DIAGNOSIS — E785 Hyperlipidemia, unspecified: Secondary | ICD-10-CM | POA: Diagnosis not present

## 2015-10-04 DIAGNOSIS — Z792 Long term (current) use of antibiotics: Secondary | ICD-10-CM | POA: Insufficient documentation

## 2015-10-04 DIAGNOSIS — Z79899 Other long term (current) drug therapy: Secondary | ICD-10-CM | POA: Diagnosis not present

## 2015-10-04 DIAGNOSIS — Z86718 Personal history of other venous thrombosis and embolism: Secondary | ICD-10-CM | POA: Diagnosis not present

## 2015-10-04 DIAGNOSIS — Z794 Long term (current) use of insulin: Secondary | ICD-10-CM | POA: Diagnosis not present

## 2015-10-04 DIAGNOSIS — E119 Type 2 diabetes mellitus without complications: Secondary | ICD-10-CM | POA: Insufficient documentation

## 2015-10-04 MED ORDER — DICLOFENAC SODIUM 1 % TD GEL: 2.0000 g | Freq: Four times a day (QID) | TRANSDERMAL | Status: DC

## 2015-10-04 MED ORDER — OXYCODONE HCL 5 MG PO TABS: 2.5000 mg | ORAL_TABLET | Freq: Four times a day (QID) | ORAL | Status: DC | PRN

## 2015-10-04 MED ORDER — ONDANSETRON 4 MG PO TBDP
4.0000 mg | ORAL_TABLET | Freq: Once | ORAL | Status: AC
  Administered 2015-10-04: 4 mg via ORAL
  Filled 2015-10-04: qty 1

## 2015-10-04 MED ORDER — HYDROCODONE-ACETAMINOPHEN 5-325 MG PO TABS
1.0000 | ORAL_TABLET | Freq: Once | ORAL | Status: AC
  Administered 2015-10-04: 1 via ORAL
  Filled 2015-10-04: qty 1

## 2015-10-04 MED ORDER — ONDANSETRON 8 MG PO TBDP
ORAL_TABLET | ORAL | Status: AC
  Filled 2015-10-04: qty 1

## 2015-10-04 NOTE — ED Provider Notes (Signed)
CSN: [id number]     Arrival date & time 10/04/15  1620 History   First MD Initiated Contact with Patient 10/04/15 1735     Chief Complaint  Patient presents with  . Knee Pain     (Consider location/radiation/quality/duration/timing/severity/associated sxs/prior Treatment) HPI   Kiara Barrett is a 64 y.o. female who complains of  left knee pain for the past 4 month(s) ago. Mechanism of injury: No known injuries. I pain is probably on the medial aspect in the soft tissues of the knee. Worse after ambulating, better with rest, tender and aching. Does not radiate. No numbness or tingling in the foot. Prior history of related problems: no prior problems with this area in the past. Has medical history of spontaneous DVT.    Past Medical History  Diagnosis Date  . Diabetes mellitus without complication (HCC)   . Hypertension   . Hyperlipemia    Past Surgical History  Procedure Laterality Date  . Cesarean section    . Tubal ligation     No family history on file. Social History  Substance Use Topics  . Smoking status: Never Smoker   . Smokeless tobacco: None  . Alcohol Use: No   OB History    No data available     Review of Systems  Ten systems reviewed and are negative for acute change, except as noted in the HPI.    Allergies  Codeine  Home Medications   Prior to Admission medications   Medication Sig Start Date End Date Taking? Authorizing Provider  amLODipine (NORVASC) 10 MG tablet Take 0.5 tablets (5 mg total) by mouth daily. 11/04/14   Tiffany Neva Seat, PA-C  amLODipine (NORVASC) 5 MG tablet Take 5 mg by mouth daily.    Historical Provider, MD  atorvastatin (LIPITOR) 20 MG tablet Take 20 mg by mouth daily.    Historical Provider, MD  cyclobenzaprine (FLEXERIL) 5 MG tablet Take 1 tablet (5 mg total) by mouth 2 (two) times daily as needed for muscle spasms. 11/04/14   Tiffany Neva Seat, PA-C  hydrochlorothiazide (MICROZIDE) 12.5 MG capsule Take 12.5 mg by mouth daily.     Historical Provider, MD  HYDROcodone-acetaminophen (NORCO/VICODIN) 5-325 MG per tablet Take 1 tablet by mouth every 6 (six) hours as needed for pain. 04/10/13   Gwyneth Sprout, MD  HYDROcodone-acetaminophen (NORCO/VICODIN) 5-325 MG per tablet Take 1-2 tablets by mouth every 4 (four) hours as needed. 09/19/13   Teressa Lower, NP  insulin aspart (NOVOLOG) 100 UNIT/ML injection Inject into the skin 3 (three) times daily before meals.    Historical Provider, MD  insulin glargine (LANTUS) 100 UNIT/ML injection Inject 45 Units into the skin 2 (two) times daily.    Historical Provider, MD  lisinopril (PRINIVIL,ZESTRIL) 20 MG tablet Take 20 mg by mouth daily.    Historical Provider, MD  lisinopril (PRINIVIL,ZESTRIL) 20 MG tablet Take 1 tablet (20 mg total) by mouth daily. 11/04/14   Tiffany Neva Seat, PA-C  naproxen (NAPROSYN) 500 MG tablet Take 1 tablet (500 mg total) by mouth 2 (two) times daily. 04/10/13   Gwyneth Sprout, MD  niacin 100 MG tablet Take 100 mg by mouth at bedtime.    Historical Provider, MD  oxycodone (OXY IR/ROXICODONE) 5 MG immediate release tablet Take 0.5-1 tablets (2.5-5 mg total) by mouth every 6 (six) hours as needed for pain. 11/04/14   Tiffany Neva Seat, PA-C  pantoprazole (PROTONIX) 20 MG tablet Take 1 tablet (20 mg total) by mouth daily. 09/19/13   Teressa Lower, NP  PENICILLIN  V POTASSIUM PO Take by mouth.    Historical Provider, MD   BP 184/79 mmHg  Pulse 78  Temp(Src) 98.3 F (36.8 C) (Oral)  Resp 16  SpO2 100% Physical Exam  Constitutional: She is oriented to person, place, and time. She appears well-developed and well-nourished. No distress.  HENT:  Head: Normocephalic and atraumatic.  Eyes: Conjunctivae are normal. No scleral icterus.  Neck: Normal range of motion.  Cardiovascular: Normal rate, regular rhythm and normal heart sounds.  Exam reveals no gallop and no friction rub.   No murmur heard. Pulmonary/Chest: Effort normal and breath sounds normal. No  respiratory distress.  Abdominal: Soft. Bowel sounds are normal. She exhibits no distension and no mass. There is no tenderness. There is no guarding.  Musculoskeletal:       Legs: Neurological: She is alert and oriented to person, place, and time.  Skin: Skin is warm and dry. She is not diaphoretic.  Nursing note and vitals reviewed.   ED Course  Procedures (including critical care time) Labs Review Labs Reviewed - No data to display  Imaging Review Dg Knee Complete 4 Views Left  10/04/2015  CLINICAL DATA:  Left knee pain 2 months.  No injury. EXAM: LEFT KNEE - COMPLETE 4+ VIEW COMPARISON:  None. FINDINGS: There is no evidence of fracture, dislocation, or joint effusion. There is no evidence of arthropathy or other focal bone abnormality. Soft tissues are unremarkable. IMPRESSION: Negative. Electronically Signed   By: Elberta Fortisaniel  Boyle M.D.   On: 10/04/2015 16:44   I have personally reviewed and evaluated these images and lab results as part of my medical decision-making.   EKG Interpretation None      MDM   Final diagnoses:  Medial knee pain, left    Patient x-ray negative for any acute abnormalities, negative DVT study. Patient placed in knee sleeve, pain medications given, follow-up with sports medicine. Appears safe for discharge at this time    Arthor Captainbigail Ferrah Panagopoulos, PA-C 10/07/15 1708  Laurence Spatesachel Morgan Little, MD 10/08/15 [phone]

## 2015-10-04 NOTE — ED Notes (Signed)
Pt states has had pain for 4 months. Pt states pain is getting worse. Pain on lateral aspect of left knee.

## 2015-10-04 NOTE — Discharge Instructions (Signed)
Cryotherapy °Cryotherapy means treatment with cold. Ice or gel packs can be used to reduce both pain and swelling. Ice is the most helpful within the first 24 to 48 hours after an injury or flare-up from overusing a muscle or joint. Sprains, strains, spasms, burning pain, shooting pain, and aches can all be eased with ice. Ice can also be used when recovering from surgery. Ice is effective, has very few side effects, and is safe for most people to use. °PRECAUTIONS  °Ice is not a safe treatment option for people with: °· Raynaud phenomenon. This is a condition affecting small blood vessels in the extremities. Exposure to cold may cause your problems to return. °· Cold hypersensitivity. There are many forms of cold hypersensitivity, including: °· Cold urticaria. Red, itchy hives appear on the skin when the tissues begin to warm after being iced. °· Cold erythema. This is a red, itchy rash caused by exposure to cold. °· Cold hemoglobinuria. Red blood cells break down when the tissues begin to warm after being iced. The hemoglobin that carry oxygen are passed into the urine because they cannot combine with blood proteins fast enough. °· Numbness or altered sensitivity in the area being iced. °If you have any of the following conditions, do not use ice until you have discussed cryotherapy with your caregiver: °· Heart conditions, such as arrhythmia, angina, or chronic heart disease. °· High blood pressure. °· Healing wounds or open skin in the area being iced. °· Current infections. °· Rheumatoid arthritis. °· Poor circulation. °· Diabetes. °Ice slows the blood flow in the region it is applied. This is beneficial when trying to stop inflamed tissues from spreading irritating chemicals to surrounding tissues. However, if you expose your skin to cold temperatures for too long or without the proper protection, you can damage your skin or nerves. Watch for signs of skin damage due to cold. °HOME CARE INSTRUCTIONS °Follow  these tips to use ice and cold packs safely. °· Place a dry or damp towel between the ice and skin. A damp towel will cool the skin more quickly, so you may need to shorten the time that the ice is used. °· For a more rapid response, add gentle compression to the ice. °· Ice for no more than 10 to 20 minutes at a time. The bonier the area you are icing, the less time it will take to get the benefits of ice. °· Check your skin after 5 minutes to make sure there are no signs of a poor response to cold or skin damage. °· Rest 20 minutes or more between uses. °· Once your skin is numb, you can end your treatment. You can test numbness by very lightly touching your skin. The touch should be so light that you do not see the skin dimple from the pressure of your fingertip. When using ice, most people will feel these normal sensations in this order: cold, burning, aching, and numbness. °· Do not use ice on someone who cannot communicate their responses to pain, such as small children or people with dementia. °HOW TO MAKE AN ICE PACK °Ice packs are the most common way to use ice therapy. Other methods include ice massage, ice baths, and cryosprays. Muscle creams that cause a cold, tingly feeling do not offer the same benefits that ice offers and should not be used as a substitute unless recommended by your caregiver. °To make an ice pack, do one of the following: °· Place crushed ice or a   bag of frozen vegetables in a sealable plastic bag. Squeeze out the excess air. Place this bag inside another plastic bag. Slide the bag into a pillowcase or place a damp towel between your skin and the bag.  Mix 3 parts water with 1 part rubbing alcohol. Freeze the mixture in a sealable plastic bag. When you remove the mixture from the freezer, it will be slushy. Squeeze out the excess air. Place this bag inside another plastic bag. Slide the bag into a pillowcase or place a damp towel between your skin and the bag. SEEK MEDICAL CARE  IF:  You develop Tullis spots on your skin. This may give the skin a blotchy (mottled) appearance.  Your skin turns blue or pale.  Your skin becomes waxy or hard.  Your swelling gets worse. MAKE SURE YOU:   Understand these instructions.  Will watch your condition.  Will get help right away if you are not doing well or get worse.   This information is not intended to replace advice given to you by your health care provider. Make sure you discuss any questions you have with your health care provider.   Document Released: 05/29/2011 Document Revised: 10/23/2014 Document Reviewed: 05/29/2011 Elsevier Interactive Patient Education 2016 [employer].  How to Use a Knee Brace A knee brace is a device that you wear to support your knee, especially if the knee is healing after an injury or surgery. There are several types of knee braces. Some are designed to prevent an injury (prophylactic brace). These are often worn during sports. Others support an injured knee (functional brace) or keep it still while it heals (rehabilitative brace). People with severe arthritis of the knee may benefit from a brace that takes some pressure off the knee (unloader brace). Most knee braces are made from a combination of cloth and metal or plastic.  You may need to wear a knee brace to:  Relieve knee pain.  Help your knee support your weight (improve stability).  Help you walk farther (improve mobility).  Prevent injury.  Support your knee while it heals from surgery or from an injury. RISKS AND COMPLICATIONS Generally, knee braces are very safe to wear. However, problems may occur, including:  Skin irritation that may lead to infection.  Making your condition worse if you wear the brace in the wrong way. HOW TO USE A KNEE BRACE Different braces will have different instructions for use. Your health care provider will tell you or show you:  How to put on your brace.  How to adjust the  brace.  When and how often to wear the brace.  How to remove the brace.  If you will need any assistive devices in addition to the brace, such as crutches or a cane. In general, your brace should:  Have the hinge of the brace line up with the bend of your knee.  Have straps, hooks, or tapes that fasten snugly around your leg.  Not feel too tight or too loose. HOW TO CARE FOR A KNEE BRACE  Check your brace often for signs of damage, such as loose connections or attachments. Your knee brace may get damaged or wear out during normal use.  Wash the fabric parts of your brace with soap and water.  Read the insert that comes with your brace for other specific care instructions. SEEK MEDICAL CARE IF:  Your knee brace is too loose or too tight and you cannot adjust it.  Your knee brace causes skin  redness, swelling, bruising, or irritation. °· Your knee brace is not helping. °· Your knee brace is making your knee pain worse. °  °This information is not intended to replace advice given to you by your health care provider. Make sure you discuss any questions you have with your health care provider. °  °Document Released: 12/23/2003 Document Revised: 06/23/2015 Document Reviewed: 01/25/2015 °Elsevier Interactive Patient Education ©2016 [employer] Inc[employer] ° ° °Knee Pain °Knee pain is a very common symptom and can have many causes. Knee pain often goes away when you follow your health care provider's instructions for relieving pain and discomfort at home. However, knee pain can develop into a condition that needs treatment. Some conditions may include: °· Arthritis caused by wear and tear (osteoarthritis). °· Arthritis caused by swelling and irritation (rheumatoid arthritis or gout). °· A cyst or growth in your knee. °· An infection in your knee joint. °· An injury that will not heal. °· Damage, swelling, or irritation of the tissues that support your knee (torn ligaments or tendinitis). °If your knee pain  continues, additional tests may be ordered to diagnose your condition. Tests may include X-rays or other imaging studies of your knee. You may also need to have fluid removed from your knee. Treatment for ongoing knee pain depends on the cause, but treatment may include: °· Medicines to relieve pain or swelling. °· Steroid injections in your knee. °· Physical therapy. °· Surgery. °HOME CARE INSTRUCTIONS °· Take medicines only as directed by your health care provider. °· Rest your knee and keep it raised (elevated) while you are resting. °· Do not do things that cause or worsen pain. °· Avoid high-impact activities or exercises, such as running, jumping rope, or doing jumping jacks. °· Apply ice to the knee area: °¨ Put ice in a plastic bag. °¨ Place a towel between your skin and the bag. °¨ Leave the ice on for 20 minutes, 2-3 times a day. °· Ask your health care provider if you should wear an elastic knee support. °· Keep a pillow under your knee when you sleep. °· Lose weight if you are overweight. Extra weight can put pressure on your knee. °· Do not use any tobacco products, including cigarettes, chewing tobacco, or electronic cigarettes. If you need help quitting, ask your health care provider. Smoking may slow the healing of any bone and joint problems that you may have. °SEEK MEDICAL CARE IF: °· Your knee pain continues, changes, or gets worse. °· You have a fever along with knee pain. °· Your knee buckles or locks up. °· Your knee becomes more swollen. °SEEK IMMEDIATE MEDICAL CARE IF:  °· Your knee joint feels hot to the touch. °· You have chest pain or trouble breathing. °  °This information is not intended to replace advice given to you by your health care provider. Make sure you discuss any questions you have with your health care provider. °  °Document Released: 07/30/2007 Document Revised: 10/23/2014 Document Reviewed: 05/18/2014 °Elsevier Interactive Patient Education ©2016 Elsevier Inc. ° ° °

## 2016-02-29 ENCOUNTER — Emergency Department (HOSPITAL_BASED_OUTPATIENT_CLINIC_OR_DEPARTMENT_OTHER)
Admission: EM | Admit: 2016-02-29 | Discharge: 2016-02-29 | Disposition: A | Discharge status: Home / Self Care | Payer: BLUE CROSS/BLUE SHIELD | Attending: Emergency Medicine | Admitting: Emergency Medicine

## 2016-02-29 ENCOUNTER — Encounter (HOSPITAL_BASED_OUTPATIENT_CLINIC_OR_DEPARTMENT_OTHER): Payer: Self-pay

## 2016-02-29 DIAGNOSIS — E785 Hyperlipidemia, unspecified: Secondary | ICD-10-CM | POA: Diagnosis not present

## 2016-02-29 DIAGNOSIS — K0889 Other specified disorders of teeth and supporting structures: Secondary | ICD-10-CM

## 2016-02-29 DIAGNOSIS — I10 Essential (primary) hypertension: Secondary | ICD-10-CM | POA: Diagnosis not present

## 2016-02-29 DIAGNOSIS — E119 Type 2 diabetes mellitus without complications: Secondary | ICD-10-CM | POA: Insufficient documentation

## 2016-02-29 DIAGNOSIS — K1379 Other lesions of oral mucosa: Secondary | ICD-10-CM | POA: Diagnosis present

## 2016-02-29 NOTE — ED Notes (Signed)
Pt c/o pain to roof of mouth after biting into olive pit on Saturday-NAD-steady gait

## 2016-02-29 NOTE — ED Provider Notes (Signed)
CSN: [id number]     Arrival date & time 02/29/16  1655 History   First MD Initiated Contact with Patient 02/29/16 1734     Chief Complaint  Patient presents with  . Mouth Injury   (Consider location/radiation/quality/duration/timing/severity/associated sxs/prior Treatment) HPI 65 y.o. female with a hx of DM, HTN, presents to the Emergency Department today with pain on the roof of her mouth s/p eating an olive pit on Saturday. States immediate pain. 7/10. Ibuprofen with minimal relief. No fevers. No pain with PO intake. Able to swallow and clear secretions. No chipped teeth. No other symptoms noted.    Past Medical History  Diagnosis Date  . Diabetes mellitus without complication (HCC)   . Hypertension   . Hyperlipemia    Past Surgical History  Procedure Laterality Date  . Cesarean section    . Tubal ligation     No family history on file. Social History  Substance Use Topics  . Smoking status: Never Smoker   . Smokeless tobacco: None  . Alcohol Use: No   OB History    No data available     Review of Systems  Constitutional: Negative for fever.  HENT: Negative for dental problem, facial swelling, trouble swallowing and voice change.    Allergies  Codeine  Home Medications   Prior to Admission medications   Medication Sig Start Date End Date Taking? Authorizing Provider  amLODipine (NORVASC) 10 MG tablet Take 0.5 tablets (5 mg total) by mouth daily. 11/04/14   Tiffany Neva SeatGreene, PA-C  amLODipine (NORVASC) 5 MG tablet Take 5 mg by mouth daily.    Historical Provider, MD  atorvastatin (LIPITOR) 20 MG tablet Take 20 mg by mouth daily.    Historical Provider, MD  cyclobenzaprine (FLEXERIL) 5 MG tablet Take 1 tablet (5 mg total) by mouth 2 (two) times daily as needed for muscle spasms. 11/04/14   Marlon Peliffany Greene, PA-C  diclofenac sodium (VOLTAREN) 1 % GEL Apply 2 g topically 4 (four) times daily. 10/04/15   Arthor CaptainAbigail Harris, PA-C  hydrochlorothiazide (MICROZIDE) 12.5 MG capsule Take  12.5 mg by mouth daily.    Historical Provider, MD  insulin aspart (NOVOLOG) 100 UNIT/ML injection Inject into the skin 3 (three) times daily before meals.    Historical Provider, MD  insulin glargine (LANTUS) 100 UNIT/ML injection Inject 45 Units into the skin 2 (two) times daily.    Historical Provider, MD  lisinopril (PRINIVIL,ZESTRIL) 20 MG tablet Take 20 mg by mouth daily.    Historical Provider, MD  lisinopril (PRINIVIL,ZESTRIL) 20 MG tablet Take 1 tablet (20 mg total) by mouth daily. 11/04/14   Tiffany Neva SeatGreene, PA-C  naproxen (NAPROSYN) 500 MG tablet Take 1 tablet (500 mg total) by mouth 2 (two) times daily. 04/10/13   Gwyneth SproutWhitney Plunkett, MD  niacin 100 MG tablet Take 100 mg by mouth at bedtime.    Historical Provider, MD  oxyCODONE (OXY IR/ROXICODONE) 5 MG immediate release tablet Take 0.5 tablets (2.5 mg total) by mouth every 6 (six) hours as needed for severe pain. 10/04/15   Arthor CaptainAbigail Harris, PA-C  pantoprazole (PROTONIX) 20 MG tablet Take 1 tablet (20 mg total) by mouth daily. 09/19/13   Teressa LowerVrinda Pickering, NP  PENICILLIN V POTASSIUM PO Take by mouth.    Historical Provider, MD   BP 168/61 mmHg  Pulse 73  Temp(Src) 98.5 F (36.9 C) (Oral)  Resp 18  Ht 5\' 5"  (1.651 m)  Wt 86.183 kg  BMI 31.62 kg/m2  SpO2 100%   Physical Exam  Constitutional: She is oriented to person, place, and time. She appears well-developed and well-nourished.  HENT:  Head: Normocephalic and atraumatic.  Mouth/Throat: Uvula is midline, oropharynx is clear and moist and mucous membranes are normal. No oral lesions. No trismus in the jaw. Normal dentition. No dental abscesses, uvula swelling, lacerations or dental caries. No oropharyngeal exudate, posterior oropharyngeal edema, posterior oropharyngeal erythema or tonsillar abscesses.  Eyes: EOM are normal. Pupils are equal, round, and reactive to light.  Neck: Normal range of motion.  Cardiovascular: Normal rate and regular rhythm.   Pulmonary/Chest: Effort normal.   Abdominal: Soft.  Musculoskeletal: Normal range of motion.  Neurological: She is alert and oriented to person, place, and time.  Skin: Skin is warm and dry.  Psychiatric: She has a normal mood and affect. Her behavior is normal. Thought content normal.  Nursing note and vitals reviewed.  ED Course  Procedures (including critical care time) Labs Review Labs Reviewed - No data to display  Imaging Review No results found. I have personally reviewed and evaluated these images and lab results as part of my medical decision-making.   EKG Interpretation None      MDM  I have reviewed the relevant previous healthcare records. I obtained HPI from historian. Patient discussed with supervising physician  ED Course:  Assessment: Pt is a 64yF who presents with pain on the roof of her mouth s/p eating olive pit. On exam, pt in NAD. Nontoxic/nonseptic appearing. VSS. Afebrile. No oropharyngeal erythema. No swelling. No infection. No dental abnormalities. Minor <62mm injury on gingiva posterior to incisor. Plan is to DC Home with follow up to PCP. At time of discharge, Patient is in no acute distress. Vital Signs are stable. Patient is able to ambulate. Patient able to tolerate PO.    Disposition/Plan:  DC Home Additional Verbal discharge instructions given and discussed with patient.  Pt Instructed to f/u with PCP in the next week for evaluation and treatment of symptoms. Return precautions given Pt acknowledges and agrees with plan  Supervising Physician Gwyneth Sprout, MD   Final diagnoses:  Pain, dental        Audry Pili, PA-C 02/29/16 1758  Gwyneth Sprout, MD 03/01/16 [id number]

## 2016-02-29 NOTE — Discharge Instructions (Signed)
Please read and follow all provided instructions.  Your diagnoses today include:  1. Pain, dental    Tests performed today include:  Vital signs. See below for your results today.   Medications prescribed:  You can use Ibuprofen 400mg  combined with Tylenol 1000mg  for pain relief every 6 hours. Do not exceed 4g of Tylenol in one 24 hour period.   Home care instructions:  Follow any educational materials contained in this packet. Use Ice over the area   Follow-up instructions: Please follow-up with your primary care provider in the next week for further evaluation of symptoms and treatment   Return instructions:   Please return to the Emergency Department if you do not get better, if you get worse, or new symptoms OR  - Fever (temperature greater than 101.35F)  - Bleeding that does not stop with holding pressure to the area    -Severe pain (please note that you may be more sore the day after your accident)  - Chest Pain  - Difficulty breathing  - Severe nausea or vomiting  - Inability to tolerate food and liquids  - Passing out  - Skin becoming red around your wounds  - Change in mental status (confusion or lethargy)  - New numbness or weakness     Please return if you have any other emergent concerns.  Additional Information:  Your vital signs today were: BP 168/61 mmHg   Pulse 73   Temp(Src) 98.5 F (36.9 C) (Oral)   Resp 18   Ht 5\' 5"  (1.651 m)   Wt 86.183 kg   BMI 31.62 kg/m2   SpO2 100% If your blood pressure (BP) was elevated above 135/85 this visit, please have this repeated by your doctor within one month. ---------------

## 2016-06-26 ENCOUNTER — Emergency Department (HOSPITAL_BASED_OUTPATIENT_CLINIC_OR_DEPARTMENT_OTHER)
Admission: EM | Admit: 2016-06-26 | Discharge: 2016-06-26 | Disposition: A | Discharge status: Home / Self Care | Payer: BLUE CROSS/BLUE SHIELD | Attending: Emergency Medicine | Admitting: Emergency Medicine

## 2016-06-26 ENCOUNTER — Encounter (HOSPITAL_BASED_OUTPATIENT_CLINIC_OR_DEPARTMENT_OTHER): Payer: Self-pay | Admitting: Adult Health

## 2016-06-26 DIAGNOSIS — Z79899 Other long term (current) drug therapy: Secondary | ICD-10-CM | POA: Insufficient documentation

## 2016-06-26 DIAGNOSIS — Z794 Long term (current) use of insulin: Secondary | ICD-10-CM | POA: Insufficient documentation

## 2016-06-26 DIAGNOSIS — I10 Essential (primary) hypertension: Secondary | ICD-10-CM | POA: Insufficient documentation

## 2016-06-26 DIAGNOSIS — M79674 Pain in right toe(s): Secondary | ICD-10-CM | POA: Insufficient documentation

## 2016-06-26 DIAGNOSIS — E119 Type 2 diabetes mellitus without complications: Secondary | ICD-10-CM | POA: Insufficient documentation

## 2016-06-26 MED ORDER — CEPHALEXIN 500 MG PO CAPS: 500.0000 mg | ORAL_CAPSULE | Freq: Four times a day (QID) | ORAL | 0 refills | Status: DC

## 2016-06-26 MED FILL — CEPHALEXIN 500 MG CAPSULE: 500 | 10 days supply | Qty: 40 | Fill #0

## 2016-06-26 NOTE — ED Triage Notes (Signed)
Presents post pedicure over one week ago, states, "the lady was digging in my great toe and now it really painfuls and red. The pain is sharp. Fredia Beetshave been putting peroxide on it. The pain is moving up my foot and into my ankle.

## 2016-06-26 NOTE — ED Notes (Signed)
PA at bedside.

## 2016-06-26 NOTE — Discharge Instructions (Signed)
Take the prescribed medication as directed. Follow-up with your primary care doctor-- recommend to have then re-check your foot within 1 week.  You may also need referra to podiatry for routine foot/nail care.  Information given for local office. Return to the ED for new or worsening symptoms.

## 2016-06-26 NOTE — ED Provider Notes (Signed)
[facility] Note   CSN: [id number] Arrival date & time: 06/26/16  1502  By signing my name below, I, Kiara Barrett, attest that this documentation has been prepared under the direction and in the presence of Sharilyn Sites, PA-C Electronically Signed: Soijett Barrett, ED Scribe. 06/26/16. 4:24 PM.   History   Chief Complaint Chief Complaint  Patient presents with  . Foot Pain    HPI Kiara Barrett is a 65 y.o. female with a PMHx of DM, HTN, hyperlipidemia, who presents to the Emergency Department complaining of constant, throbbing, right foot pain onset 1 week ago. Pt states that her right foot pain is localized to her right great toe and that she went for a pedicure prior to the onset of her symptoms. Pt states her skin adjacent to the nail was nicked during her pedicure. Pt states that her right great toe pain is radiating up her right lower leg. Pt is having associated symptoms of redness to right great toe and mild swelling to right great toe. She notes that she has tried hydrogen peroxide without medications for the relief of her symptoms. She denies wound, rash, drainage, fever, chills, and any other symptoms. Pt notes that she is allergic to codeine.   Has been doing warm water and peroxide soaks at home.   The history is provided by the patient. No language interpreter was used.      Past Medical History:  Diagnosis Date  . Diabetes mellitus without complication (HCC)   . Hyperlipemia   . Hypertension     There are no active problems to display for this patient.   Past Surgical History:  Procedure Laterality Date  . CESAREAN SECTION    . TUBAL LIGATION      OB History    No data available       Home Medications    Prior to Admission medications   Medication Sig Start Date End Date Taking? Authorizing Provider  amLODipine (NORVASC) 10 MG tablet Take 0.5 tablets (5 mg total) by mouth daily. 11/04/14   Tiffany Neva Seat, PA-C  amLODipine (NORVASC)  5 MG tablet Take 5 mg by mouth daily.    Historical Provider, MD  atorvastatin (LIPITOR) 20 MG tablet Take 20 mg by mouth daily.    Historical Provider, MD  cyclobenzaprine (FLEXERIL) 5 MG tablet Take 1 tablet (5 mg total) by mouth 2 (two) times daily as needed for muscle spasms. 11/04/14   Marlon Pel, PA-C  diclofenac sodium (VOLTAREN) 1 % GEL Apply 2 g topically 4 (four) times daily. 10/04/15   Arthor Captain, PA-C  hydrochlorothiazide (MICROZIDE) 12.5 MG capsule Take 12.5 mg by mouth daily.    Historical Provider, MD  insulin aspart (NOVOLOG) 100 UNIT/ML injection Inject into the skin 3 (three) times daily before meals.    Historical Provider, MD  insulin glargine (LANTUS) 100 UNIT/ML injection Inject 45 Units into the skin 2 (two) times daily.    Historical Provider, MD  lisinopril (PRINIVIL,ZESTRIL) 20 MG tablet Take 20 mg by mouth daily.    Historical Provider, MD  lisinopril (PRINIVIL,ZESTRIL) 20 MG tablet Take 1 tablet (20 mg total) by mouth daily. 11/04/14   Tiffany Neva Seat, PA-C  naproxen (NAPROSYN) 500 MG tablet Take 1 tablet (500 mg total) by mouth 2 (two) times daily. 04/10/13   Gwyneth Sprout, MD  niacin 100 MG tablet Take 100 mg by mouth at bedtime.    Historical Provider, MD  oxyCODONE (OXY IR/ROXICODONE) 5 MG immediate release tablet Take  0.5 tablets (2.5 mg total) by mouth every 6 (six) hours as needed for severe pain. 10/04/15   Arthor CaptainAbigail Harris, PA-C  pantoprazole (PROTONIX) 20 MG tablet Take 1 tablet (20 mg total) by mouth daily. 09/19/13   Teressa LowerVrinda Pickering, NP  PENICILLIN V POTASSIUM PO Take by mouth.    Historical Provider, MD    Family History History reviewed. No pertinent family history.  Social History Social History  Substance Use Topics  . Smoking status: Never Smoker  . Smokeless tobacco: Not on file  . Alcohol use No     Allergies   Codeine   Review of Systems Review of Systems  Constitutional: Negative for chills and fever.  Musculoskeletal: Positive  for arthralgias (right great toe) and joint swelling (right great toe).  Skin: Positive for color change (redness to right great toe). Negative for rash and wound.  All other systems reviewed and are negative.    Physical Exam Updated Vital Signs BP 169/67 (BP Location: Right Arm)   Pulse 70   Temp 99 F (37.2 C) (Oral)   Resp 18   Ht 5\' 5"  (1.651 m)   Wt 195 lb (88.5 kg)   SpO2 100%   BMI 32.45 kg/m   Physical Exam  Constitutional: She is oriented to person, place, and time. She appears well-developed and well-nourished.  HENT:  Head: Normocephalic and atraumatic.  Mouth/Throat: Oropharynx is clear and moist.  Eyes: Conjunctivae and EOM are normal. Pupils are equal, round, and reactive to light.  Neck: Normal range of motion.  Cardiovascular: Normal rate, regular rhythm and normal heart sounds.   Pulmonary/Chest: Effort normal and breath sounds normal.  Abdominal: Soft. Bowel sounds are normal.  Musculoskeletal: Normal range of motion.       Right foot: There is tenderness and swelling.  Right great toe with swelling and redness of distal tip of toe and beneath the toenail. Skin is locally TTP. No definite paronychia or nailbed injury. Edges of nail are visible and do not appear significantly ingrown.  No drainage or abscess formation.  Remainder of toe is overall normal in appearance. Normal sensation, foot warm and well perfused  Neurological: She is alert and oriented to person, place, and time.  Skin: Skin is warm and dry.  Psychiatric: She has a normal mood and affect.  Nursing note and vitals reviewed.    ED Treatments / Results  DIAGNOSTIC STUDIES: Oxygen Saturation is 100% on RA, nl by my interpretation.    COORDINATION OF CARE: 4:17 PM Discussed treatment plan with pt at bedside which includes abx Rx, follow up with PCP, referral and follow up with podiatrist, and pt agreed to plan.   Procedures Procedures (including critical care time)  Medications Ordered  in ED Medications - No data to display   Initial Impression / Assessment and Plan / ED Course  I have reviewed the triage vital signs and the nursing notes.   Clinical Course   65 y.o. F here with right great toe pain.  Pain is localized to nail area.  Some redness and swelling localized to tip of right great toe and beneath the nail. No drainage or abscess formation.  Toenail edges are visible and do not appear significant ingrown.  Nail and nailbed remain intact.  Patient is known diabetic.  Denies fever, chills, and is overall well appearing.  Concern for developing infection given her symptoms and known hx of diabetes.  Will start on course of abx.  Encouraged to continue warm soaks  at home.  Follow-up with PCP for re-check later this week.  Discussed plan with patient, she acknowledged understanding and agreed with plan of care.  Return precautions given for new or worsening symptoms.  Final Clinical Impressions(s) / ED Diagnoses   Final diagnoses:  Great toe pain, right    New Prescriptions New Prescriptions   CEPHALEXIN (KEFLEX) 500 MG CAPSULE    Take 1 capsule (500 mg total) by mouth 4 (four) times daily.   I personally performed the services described in this documentation, which was scribed in my presence. The recorded information has been reviewed and is accurate.    Garlon Hatchet, PA-C 06/26/16 1740    Geoffery Lyons, MD 06/26/16 2040

## 2016-12-05 ENCOUNTER — Emergency Department (HOSPITAL_BASED_OUTPATIENT_CLINIC_OR_DEPARTMENT_OTHER)
Admission: EM | Admit: 2016-12-05 | Discharge: 2016-12-05 | Disposition: A | Discharge status: Home / Self Care | Payer: BLUE CROSS/BLUE SHIELD | Attending: Emergency Medicine | Admitting: Emergency Medicine

## 2016-12-05 ENCOUNTER — Emergency Department (HOSPITAL_BASED_OUTPATIENT_CLINIC_OR_DEPARTMENT_OTHER): Payer: BLUE CROSS/BLUE SHIELD

## 2016-12-05 ENCOUNTER — Encounter (HOSPITAL_BASED_OUTPATIENT_CLINIC_OR_DEPARTMENT_OTHER): Payer: Self-pay | Admitting: *Deleted

## 2016-12-05 DIAGNOSIS — R509 Fever, unspecified: Secondary | ICD-10-CM | POA: Diagnosis present

## 2016-12-05 DIAGNOSIS — J111 Influenza due to unidentified influenza virus with other respiratory manifestations: Secondary | ICD-10-CM

## 2016-12-05 DIAGNOSIS — R05 Cough: Secondary | ICD-10-CM | POA: Diagnosis not present

## 2016-12-05 DIAGNOSIS — E119 Type 2 diabetes mellitus without complications: Secondary | ICD-10-CM | POA: Diagnosis not present

## 2016-12-05 DIAGNOSIS — R5383 Other fatigue: Secondary | ICD-10-CM | POA: Diagnosis not present

## 2016-12-05 DIAGNOSIS — R69 Illness, unspecified: Secondary | ICD-10-CM

## 2016-12-05 DIAGNOSIS — R0602 Shortness of breath: Secondary | ICD-10-CM | POA: Diagnosis not present

## 2016-12-05 DIAGNOSIS — I10 Essential (primary) hypertension: Secondary | ICD-10-CM | POA: Diagnosis not present

## 2016-12-05 DIAGNOSIS — Z79899 Other long term (current) drug therapy: Secondary | ICD-10-CM | POA: Diagnosis not present

## 2016-12-05 DIAGNOSIS — Z794 Long term (current) use of insulin: Secondary | ICD-10-CM | POA: Insufficient documentation

## 2016-12-05 LAB — CBC WITH DIFFERENTIAL/PLATELET
Basophils Absolute: 0 10*3/uL (ref 0.0–0.1)
Basophils Relative: 0 %
EOS ABS: 0 10*3/uL (ref 0.0–0.7)
Eosinophils Relative: 1 %
HEMATOCRIT: 39.1 % (ref 36.0–46.0)
HEMOGLOBIN: 13 g/dL (ref 12.0–15.0)
LYMPHS ABS: 0.9 10*3/uL (ref 0.7–4.0)
Lymphocytes Relative: 19 %
MCH: 28.4 pg (ref 26.0–34.0)
MCHC: 33.2 g/dL (ref 30.0–36.0)
MCV: 85.4 fL (ref 78.0–100.0)
MONOS PCT: 13 %
Monocytes Absolute: 0.6 10*3/uL (ref 0.1–1.0)
NEUTROS PCT: 67 %
Neutro Abs: 3.1 10*3/uL (ref 1.7–7.7)
Platelets: 221 10*3/uL (ref 150–400)
RBC: 4.58 MIL/uL (ref 3.87–5.11)
RDW: 13.7 % (ref 11.5–15.5)
WBC: 4.6 10*3/uL (ref 4.0–10.5)

## 2016-12-05 LAB — BASIC METABOLIC PANEL
Anion gap: 6 (ref 5–15)
BUN: 19 mg/dL (ref 6–20)
CHLORIDE: 103 mmol/L (ref 101–111)
CO2: 25 mmol/L (ref 22–32)
Calcium: 8.4 mg/dL — ABNORMAL LOW (ref 8.9–10.3)
Creatinine, Ser: 1.9 mg/dL — ABNORMAL HIGH (ref 0.44–1.00)
GFR calc Af Amer: 31 mL/min — ABNORMAL LOW (ref >60)
GFR calc non Af Amer: 27 mL/min — ABNORMAL LOW (ref >60)
GLUCOSE: 276 mg/dL — AB (ref 65–99)
POTASSIUM: 4.6 mmol/L (ref 3.5–5.1)
Sodium: 134 mmol/L — ABNORMAL LOW (ref 135–145)

## 2016-12-05 LAB — I-STAT CG4 LACTIC ACID, ED: LACTIC ACID, VENOUS: 1.25 mmol/L (ref 0.5–1.9)

## 2016-12-05 MED ORDER — ONDANSETRON HCL 4 MG PO TABS: 4.0000 mg | ORAL_TABLET | Freq: Three times a day (TID) | ORAL | 0 refills | Status: DC | PRN

## 2016-12-05 MED ORDER — AZITHROMYCIN 250 MG PO TABS: 250.0000 mg | ORAL_TABLET | Freq: Every day | ORAL | 0 refills | Status: DC

## 2016-12-05 MED ORDER — HYDROCODONE-HOMATROPINE 5-1.5 MG/5ML PO SYRP: 5.0000 mL | ORAL_SOLUTION | Freq: Four times a day (QID) | ORAL | 0 refills | Status: DC | PRN

## 2016-12-05 MED ORDER — ACETAMINOPHEN 500 MG PO TABS
1000.0000 mg | ORAL_TABLET | Freq: Once | ORAL | Status: AC
  Administered 2016-12-05: 1000 mg via ORAL
  Filled 2016-12-05: qty 2

## 2016-12-05 MED ORDER — ACETAMINOPHEN 325 MG PO TABS: 650.0000 mg | ORAL_TABLET | Freq: Once | ORAL | Status: DC

## 2016-12-05 MED ORDER — OSELTAMIVIR PHOSPHATE 30 MG PO CAPS: 30.0000 mg | ORAL_CAPSULE | Freq: Two times a day (BID) | ORAL | 0 refills | Status: AC

## 2016-12-05 MED ORDER — SODIUM CHLORIDE 0.9 % IV BOLUS (SEPSIS)
1000.0000 mL | Freq: Once | INTRAVENOUS | Status: AC
  Administered 2016-12-05: 1000 mL via INTRAVENOUS

## 2016-12-05 MED FILL — OSELTAMIVIR PHOS 30 MG CAP: 30 | 5 days supply | Qty: 10 | Fill #0

## 2016-12-05 MED FILL — AZITHROMYCIN 250 MG TABLET: 250 | 5 days supply | Qty: 6 | Fill #0

## 2016-12-05 MED FILL — ONDANSETRON HCL 4 MG TABLET: 4 | 3 days supply | Qty: 9 | Fill #0

## 2016-12-05 MED FILL — HYDROCODONE-HOMATROPINE SYR: 5-1.5 | 6 days supply | Qty: 120 | Fill #0

## 2016-12-05 NOTE — ED Provider Notes (Signed)
[facility]   CSN: [id number] Arrival date & time: 12/05/16  0732     History   Chief Complaint Chief Complaint  Patient presents with  . Fever    HPI Kiara Barrett is a 66 y.o. female.  HPI 66 year old female with history of hypertension, hyperlipidemia, and diabetes who presents with a 2 day history of cough, fever, and generalized body aches. The patient has a known sick contact in her grandson who recently was diagnosed with flu. The patient states her symptoms started 2 days ago with acute onset of generalized body aches and cramping. She then developed cough with mild shortness of breath. She's had no associated chest pain. No nausea or vomiting with it. She has not taken anything today for her fever. Denies any abdominal pain, dysuria, vaginal bleeding, or vaginal discharge. She has been trying to drink fluids but this has not improved her symptoms. No history of lung problems or COPD or asthma. She does not smoke.  Past Medical History:  Diagnosis Date  . Diabetes mellitus without complication (HCC)   . Hyperlipemia   . Hypertension     There are no active problems to display for this patient.   Past Surgical History:  Procedure Laterality Date  . CESAREAN SECTION    . TUBAL LIGATION      OB History    No data available       Home Medications    Prior to Admission medications   Medication Sig Start Date End Date Taking? Authorizing Provider  amLODipine (NORVASC) 10 MG tablet Take 0.5 tablets (5 mg total) by mouth daily. 11/04/14   Tiffany Neva Seat, PA-C  amLODipine (NORVASC) 5 MG tablet Take 5 mg by mouth daily.    Historical Provider, MD  atorvastatin (LIPITOR) 20 MG tablet Take 20 mg by mouth daily.    Historical Provider, MD  azithromycin (ZITHROMAX) 250 MG tablet Take 1 tablet (250 mg total) by mouth daily. Take first 2 tablets together, then 1 every day until finished. 12/05/16   Shaune Pollack, MD  hydrochlorothiazide  (MICROZIDE) 12.5 MG capsule Take 12.5 mg by mouth daily.    Historical Provider, MD  HYDROcodone-homatropine (HYCODAN) 5-1.5 MG/5ML syrup Take 5 mLs by mouth every 6 (six) hours as needed for cough. 12/05/16   Shaune Pollack, MD  insulin aspart (NOVOLOG) 100 UNIT/ML injection Inject into the skin 3 (three) times daily before meals.    Historical Provider, MD  insulin glargine (LANTUS) 100 UNIT/ML injection Inject 45 Units into the skin 2 (two) times daily.    Historical Provider, MD  lisinopril (PRINIVIL,ZESTRIL) 20 MG tablet Take 20 mg by mouth daily.    Historical Provider, MD  lisinopril (PRINIVIL,ZESTRIL) 20 MG tablet Take 1 tablet (20 mg total) by mouth daily. 11/04/14   Tiffany Neva Seat, PA-C  naproxen (NAPROSYN) 500 MG tablet Take 1 tablet (500 mg total) by mouth 2 (two) times daily. 04/10/13   Gwyneth Sprout, MD  ondansetron (ZOFRAN) 4 MG tablet Take 1 tablet (4 mg total) by mouth every 8 (eight) hours as needed for nausea or vomiting. 12/05/16   Shaune Pollack, MD  oseltamivir (TAMIFLU) 30 MG capsule Take 1 capsule (30 mg total) by mouth every 12 (twelve) hours. 12/05/16 12/10/16  Shaune Pollack, MD  pantoprazole (PROTONIX) 20 MG tablet Take 1 tablet (20 mg total) by mouth daily. 09/19/13   Teressa Lower, NP    Family History History reviewed. No pertinent family history.  Social History Social History  Substance  Use Topics  . Smoking status: Never Smoker  . Smokeless tobacco: Never Used  . Alcohol use No     Allergies   Codeine   Review of Systems Review of Systems  Constitutional: Positive for fatigue. Negative for chills and fever.  HENT: Negative for congestion, rhinorrhea and sore throat.   Eyes: Negative for visual disturbance.  Respiratory: Positive for cough and shortness of breath. Negative for wheezing.   Cardiovascular: Negative for chest pain and leg swelling.  Gastrointestinal: Negative for abdominal pain, diarrhea, nausea and vomiting.  Genitourinary: Negative  for dysuria, flank pain, vaginal bleeding and vaginal discharge.  Musculoskeletal: Positive for arthralgias and myalgias. Negative for neck pain and neck stiffness.  Skin: Negative for rash and wound.  Allergic/Immunologic: Negative for immunocompromised state.  Neurological: Negative for syncope, weakness and headaches.  Hematological: Does not bruise/bleed easily.  All other systems reviewed and are negative.    Physical Exam Updated Vital Signs BP 137/63 (BP Location: Right Arm)   Pulse 93   Temp 100.8 F (38.2 C) (Oral)   Resp 20   Ht 5' 5.5" (1.664 m)   Wt 190 lb (86.2 kg)   SpO2 96%   BMI 31.14 kg/m   Physical Exam  Constitutional: She is oriented to person, place, and time. She appears well-developed and well-nourished. No distress.  HENT:  Head: Normocephalic and atraumatic.  Mouth/Throat: Oropharynx is clear and moist.  Mild posterior pharyngeal erythema without tonsillar swelling or exudates  Eyes: Conjunctivae are normal.  Neck: Neck supple.  Cardiovascular: Normal rate, regular rhythm and normal heart sounds.  Exam reveals no friction rub.   No murmur heard. Pulmonary/Chest: Effort normal and breath sounds normal. No respiratory distress. She has no wheezes. She has no rales.  Abdominal: She exhibits no distension.  Musculoskeletal: She exhibits no edema.  Neurological: She is alert and oriented to person, place, and time. She exhibits normal muscle tone.  Skin: Skin is warm. Capillary refill takes less than 2 seconds.  Psychiatric: She has a normal mood and affect.  Nursing note and vitals reviewed.    ED Treatments / Results  Labs (all labs ordered are listed, but only abnormal results are displayed) Labs Reviewed  BASIC METABOLIC PANEL - Abnormal; Notable for the following:       Result Value   Sodium 134 (*)    Glucose, Bld 276 (*)    Creatinine, Ser 1.90 (*)    Calcium 8.4 (*)    GFR calc non Af Amer 27 (*)    GFR calc Af Amer 31 (*)    All  other components within normal limits  CBC WITH DIFFERENTIAL/PLATELET  I-STAT CG4 LACTIC ACID, ED    EKG  EKG Interpretation None       Radiology Dg Chest 2 View  Result Date: 12/05/2016 CLINICAL DATA:  Cough and congestion. EXAM: CHEST  2 VIEW COMPARISON:  09/19/2013 . FINDINGS: Mediastinum and hilar structures normal. Lungs are clear. No pleural effusion or pneumothorax. Heart size normal. No acute bony abnormality. IMPRESSION: No acute cardiopulmonary disease. Electronically Signed   By: Maisie Fus  Register   On: 12/05/2016 08:59    Procedures Procedures (including critical care time)  Medications Ordered in ED Medications  sodium chloride 0.9 % bolus 1,000 mL (0 mLs Intravenous Stopped 12/05/16 1043)  acetaminophen (TYLENOL) tablet 1,000 mg (1,000 mg Oral Given 12/05/16 0900)     Initial Impression / Assessment and Plan / ED Course  I have reviewed the triage vital signs  and the nursing notes.  Pertinent labs & imaging results that were available during my care of the patient were reviewed by me and considered in my medical decision making (see chart for details).     66 year old overall well-appearing female who presents with cough, mild shortness of breath, body aches, and myalgias in the setting of known sick contacts. Suspect influenza or influenza-like illness. Chest x-ray is without evidence of pneumonia. Her labs are otherwise reassuring with normal white blood cell count as well as normal lactic acid. Renal function is elevated with Cr 1.9 but this is baseline per review of OSH records, without AKI. She is satting well on room air and vital signs are markedly improved following fever control. She has been given IV fluids here. Given normal white count, lactic acid, and well appearance, will discharge with Tamiflu as well as azithromycin for possible concomitant early community acquired pneumonia. Will discharge with outpatient follow-up.  Final Clinical Impressions(s) / ED  Diagnoses   Final diagnoses:  Influenza-like illness    New Prescriptions Discharge Medication List as of 12/05/2016 10:12 AM    START taking these medications   Details  azithromycin (ZITHROMAX) 250 MG tablet Take 1 tablet (250 mg total) by mouth daily. Take first 2 tablets together, then 1 every day until finished., Starting Tue 12/05/2016, Print    HYDROcodone-homatropine (HYCODAN) 5-1.5 MG/5ML syrup Take 5 mLs by mouth every 6 (six) hours as needed for cough., Starting Tue 12/05/2016, Print    ondansetron (ZOFRAN) 4 MG tablet Take 1 tablet (4 mg total) by mouth every 8 (eight) hours as needed for nausea or vomiting., Starting Tue 12/05/2016, Print    oseltamivir (TAMIFLU) 30 MG capsule Take 1 capsule (30 mg total) by mouth every 12 (twelve) hours., Starting Tue 12/05/2016, Until Sun 12/10/2016, Print         Shaune Pollackameron Catilyn Boggus, MD 12/05/16 [phone]

## 2016-12-05 NOTE — ED Triage Notes (Signed)
Pt reports congestion, cough and fevers x Sunday. Denies any n/v/d.

## 2017-04-23 ENCOUNTER — Emergency Department (HOSPITAL_BASED_OUTPATIENT_CLINIC_OR_DEPARTMENT_OTHER)
Admission: EM | Admit: 2017-04-23 | Discharge: 2017-04-23 | Disposition: A | Discharge status: Home / Self Care | Payer: BLUE CROSS/BLUE SHIELD | Attending: Emergency Medicine | Admitting: Emergency Medicine

## 2017-04-23 ENCOUNTER — Emergency Department (HOSPITAL_BASED_OUTPATIENT_CLINIC_OR_DEPARTMENT_OTHER): Payer: BLUE CROSS/BLUE SHIELD

## 2017-04-23 ENCOUNTER — Encounter (HOSPITAL_BASED_OUTPATIENT_CLINIC_OR_DEPARTMENT_OTHER): Payer: Self-pay | Admitting: Emergency Medicine

## 2017-04-23 DIAGNOSIS — E119 Type 2 diabetes mellitus without complications: Secondary | ICD-10-CM | POA: Diagnosis not present

## 2017-04-23 DIAGNOSIS — Z79899 Other long term (current) drug therapy: Secondary | ICD-10-CM | POA: Diagnosis not present

## 2017-04-23 DIAGNOSIS — I10 Essential (primary) hypertension: Secondary | ICD-10-CM | POA: Diagnosis not present

## 2017-04-23 DIAGNOSIS — Z794 Long term (current) use of insulin: Secondary | ICD-10-CM | POA: Diagnosis not present

## 2017-04-23 DIAGNOSIS — N12 Tubulo-interstitial nephritis, not specified as acute or chronic: Secondary | ICD-10-CM | POA: Diagnosis not present

## 2017-04-23 DIAGNOSIS — R1011 Right upper quadrant pain: Secondary | ICD-10-CM | POA: Diagnosis present

## 2017-04-23 LAB — URINALYSIS, ROUTINE W REFLEX MICROSCOPIC
BILIRUBIN URINE: NEGATIVE
HGB URINE DIPSTICK: NEGATIVE
KETONES UR: NEGATIVE mg/dL
Nitrite: NEGATIVE
PH: 6.5 (ref 5.0–8.0)
Protein, ur: 100 mg/dL — AB
Specific Gravity, Urine: 1.021 (ref 1.005–1.030)

## 2017-04-23 LAB — COMPREHENSIVE METABOLIC PANEL
ALBUMIN: 3.6 g/dL (ref 3.5–5.0)
ALT: 16 U/L (ref 14–54)
ANION GAP: 6 (ref 5–15)
AST: 15 U/L (ref 15–41)
Alkaline Phosphatase: 77 U/L (ref 38–126)
BILIRUBIN TOTAL: 0.4 mg/dL (ref 0.3–1.2)
BUN: 31 mg/dL — ABNORMAL HIGH (ref 6–20)
CO2: 28 mmol/L (ref 22–32)
Calcium: 9.1 mg/dL (ref 8.9–10.3)
Chloride: 102 mmol/L (ref 101–111)
Creatinine, Ser: 1.43 mg/dL — ABNORMAL HIGH (ref 0.44–1.00)
GFR calc non Af Amer: 37 mL/min — ABNORMAL LOW (ref >60)
GFR, EST AFRICAN AMERICAN: 43 mL/min — AB (ref >60)
GLUCOSE: 293 mg/dL — AB (ref 65–99)
POTASSIUM: 4.5 mmol/L (ref 3.5–5.1)
SODIUM: 136 mmol/L (ref 135–145)
Total Protein: 7.3 g/dL (ref 6.5–8.1)

## 2017-04-23 LAB — CBC WITH DIFFERENTIAL/PLATELET
Basophils Absolute: 0 10*3/uL (ref 0.0–0.1)
Basophils Relative: 0 %
EOS ABS: 0.1 10*3/uL (ref 0.0–0.7)
Eosinophils Relative: 2 %
HCT: 32 % — ABNORMAL LOW (ref 36.0–46.0)
Hemoglobin: 10.8 g/dL — ABNORMAL LOW (ref 12.0–15.0)
Lymphocytes Relative: 30 %
Lymphs Abs: 1.5 10*3/uL (ref 0.7–4.0)
MCH: 28.5 pg (ref 26.0–34.0)
MCHC: 33.8 g/dL (ref 30.0–36.0)
MCV: 84.4 fL (ref 78.0–100.0)
MONO ABS: 0.6 10*3/uL (ref 0.1–1.0)
MONOS PCT: 13 %
Neutro Abs: 2.7 10*3/uL (ref 1.7–7.7)
Neutrophils Relative %: 55 %
Platelets: 235 10*3/uL (ref 150–400)
RBC: 3.79 MIL/uL — ABNORMAL LOW (ref 3.87–5.11)
RDW: 12.8 % (ref 11.5–15.5)
WBC: 5 10*3/uL (ref 4.0–10.5)

## 2017-04-23 LAB — URINALYSIS, MICROSCOPIC (REFLEX)

## 2017-04-23 MED ORDER — SODIUM CHLORIDE 0.9 % IV BOLUS (SEPSIS)
1000.0000 mL | Freq: Once | INTRAVENOUS | Status: AC
  Administered 2017-04-23: 1000 mL via INTRAVENOUS

## 2017-04-23 MED ORDER — HYDROCODONE-ACETAMINOPHEN 5-325 MG PO TABS: 1.0000 | ORAL_TABLET | Freq: Four times a day (QID) | ORAL | 0 refills | Status: DC | PRN

## 2017-04-23 MED ORDER — DEXTROSE 5 % IV SOLN
1.0000 g | Freq: Once | INTRAVENOUS | Status: AC
  Administered 2017-04-23: 1 g via INTRAVENOUS
  Filled 2017-04-23: qty 10

## 2017-04-23 MED ORDER — MORPHINE SULFATE (PF) 4 MG/ML IV SOLN
4.0000 mg | Freq: Once | INTRAVENOUS | Status: AC
  Administered 2017-04-23: 4 mg via INTRAVENOUS
  Filled 2017-04-23: qty 1

## 2017-04-23 MED ORDER — CEPHALEXIN 500 MG PO CAPS: 500.0000 mg | ORAL_CAPSULE | Freq: Three times a day (TID) | ORAL | 0 refills | Status: AC

## 2017-04-23 MED ORDER — ONDANSETRON HCL 4 MG/2ML IJ SOLN
4.0000 mg | Freq: Once | INTRAMUSCULAR | Status: AC
  Administered 2017-04-23: 4 mg via INTRAVENOUS
  Filled 2017-04-23: qty 2

## 2017-04-23 MED FILL — HYDROCODON-APAP 5-325: 5-325 | 2 days supply | Qty: 10 | Fill #0

## 2017-04-23 MED FILL — CEPHALEXIN 500 MG CAPSULE: 500 | 10 days supply | Qty: 30 | Fill #0

## 2017-04-23 NOTE — ED Provider Notes (Signed)
[facility]   CSN: [id number] Arrival date & time: 04/23/17  1023     History   Chief Complaint Chief Complaint  Patient presents with  . Back Pain    HPI Kiara Barrett is a 66 y.o. female history diabetes, hypertension, hyperlipidemia here presenting with right flank pain, dysuria. Patient states that she has right flank pain for the last 2 days. Initially started as diffuse low back pain but then localized to the right flank. States that it is sharp and has no radiation. Patient denies any numbness or weakness of the legs. Does have some urinary frequency and burning with urination but denies any hematuria. Patient states that she has history of kidney stones in the past and this is similar to previous but not as severe.  The history is provided by the patient.    Past Medical History:  Diagnosis Date  . Diabetes mellitus without complication (HCC)   . Hyperlipemia   . Hypertension     There are no active problems to display for this patient.   Past Surgical History:  Procedure Laterality Date  . CESAREAN SECTION    . TUBAL LIGATION      OB History    No data available       Home Medications    Prior to Admission medications   Medication Sig Start Date End Date Taking? Authorizing Provider  carvedilol (COREG) 6.25 MG tablet Take 6.25 mg by mouth 2 (two) times daily with a meal.   Yes [provider]  amLODipine (NORVASC) 10 MG tablet Take 0.5 tablets (5 mg total) by mouth daily. 11/04/14   Marlon Pel, PA-C  atorvastatin (LIPITOR) 20 MG tablet Take 20 mg by mouth daily.    [provider]  hydrochlorothiazide (MICROZIDE) 12.5 MG capsule Take 12.5 mg by mouth daily.    [provider]  insulin aspart (NOVOLOG) 100 UNIT/ML injection Inject into the skin 3 (three) times daily before meals.    [provider]  insulin glargine (LANTUS) 100 UNIT/ML injection Inject 45 Units into the skin 2 (two) times  daily.    [provider]    Family History No family history on file.  Social History Social History  Substance Use Topics  . Smoking status: Never Smoker  . Smokeless tobacco: Never Used  . Alcohol use No     Allergies   Codeine   Review of Systems Review of Systems  Musculoskeletal: Positive for back pain.  All other systems reviewed and are negative.    Physical Exam Updated Vital Signs BP (!) 150/62   Pulse (!) 58   Temp 98.7 F (37.1 C) (Oral)   Resp 16   Ht 5' 5.5" (1.664 m)   Wt 90.7 kg (200 lb)   SpO2 100%   BMI 32.78 kg/m   Physical Exam  Constitutional: She is oriented to person, place, and time.  Uncomfortable   HENT:  Head: Normocephalic.  Mouth/Throat: Oropharynx is clear and moist.  Eyes: Conjunctivae and EOM are normal. Pupils are equal, round, and reactive to light.  Neck: Normal range of motion. Neck supple.  Cardiovascular: Normal rate, regular rhythm and normal heart sounds.   Pulmonary/Chest: Effort normal and breath sounds normal. No respiratory distress. She has no wheezes.  Abdominal: Soft. Bowel sounds are normal.  + R CVAT vs paralumbar tenderness   Musculoskeletal: Normal range of motion.  Neurological: She is alert and oriented to person, place, and time.  No saddle  anesthesia. Neurovascular intact bilateral lower extremities   Skin: Skin is warm.  Psychiatric: She has a normal mood and affect.  Nursing note and vitals reviewed.    ED Treatments / Results  Labs (all labs ordered are listed, but only abnormal results are displayed) Labs Reviewed  URINALYSIS, ROUTINE W REFLEX MICROSCOPIC - Abnormal; Notable for the following:       Result Value   Color, Urine GREEN (*)    APPearance CLOUDY (*)    Glucose, UA >=500 (*)    Protein, ur 100 (*)    Leukocytes, UA TRACE (*)    All other components within normal limits  CBC WITH DIFFERENTIAL/PLATELET - Abnormal; Notable for the following:    RBC 3.79 (*)     Hemoglobin 10.8 (*)    HCT 32.0 (*)    All other components within normal limits  COMPREHENSIVE METABOLIC PANEL - Abnormal; Notable for the following:    Glucose, Bld 293 (*)    BUN 31 (*)    Creatinine, Ser 1.43 (*)    GFR calc non Af Amer 37 (*)    GFR calc Af Amer 43 (*)    All other components within normal limits  URINALYSIS, MICROSCOPIC (REFLEX) - Abnormal; Notable for the following:    Bacteria, UA MANY (*)    Squamous Epithelial / LPF 6-30 (*)    All other components within normal limits  URINE CULTURE    EKG  EKG Interpretation None       Radiology Ct Renal Stone Study  Result Date: 04/23/2017 CLINICAL DATA:  Right flank pain for 1 week. EXAM: CT ABDOMEN AND PELVIS WITHOUT CONTRAST TECHNIQUE: Multidetector CT imaging of the abdomen and pelvis was performed following the standard protocol without IV contrast. COMPARISON:  None. FINDINGS: Lower chest: Lung bases show no acute findings. Heart size normal. No pericardial or pleural effusion. Hepatobiliary: Liver is somewhat heterogeneous. A stone or stones are seen in the gallbladder. No biliary ductal dilatation. Pancreas: Negative. Spleen: Negative. Adrenals/Urinary Tract: Adrenal glands and kidneys are unremarkable. Likely renal vascular calcifications on the right. Ureters are decompressed. Bladder is grossly unremarkable. Stomach/Bowel: Stomach, small bowel, appendix and colon are unremarkable. Vascular/Lymphatic: Atherosclerotic calcification of the arterial vasculature without abdominal aortic aneurysm. No pathologically enlarged lymph nodes. Reproductive: Partially calcified fibroid off the fundus of the uterus measures 2.3 cm. No adnexal mass. Other: No free fluid.  Mesenteries and peritoneum are unremarkable. Musculoskeletal: No worrisome lytic or sclerotic lesions. IMPRESSION: 1. No acute findings to explain the patient's pain. 2. Hepatic parenchymal heterogeneity can be seen with steatosis. 3. Cholelithiasis. 4.  Aortic  atherosclerosis (ICD10-170.0). Electronically Signed   By: Leanna Battles M.D.   On: 04/23/2017 11:23    Procedures Procedures (including critical care time)  Medications Ordered in ED Medications  sodium chloride 0.9 % bolus 1,000 mL (0 mLs Intravenous Stopped 04/23/17 1218)  morphine 4 MG/ML injection 4 mg (4 mg Intravenous Given 04/23/17 1134)  ondansetron (ZOFRAN) injection 4 mg (4 mg Intravenous Given 04/23/17 1134)  cefTRIAXone (ROCEPHIN) 1 g in dextrose 5 % 50 mL IVPB (0 g Intravenous Stopped 04/23/17 1217)     Initial Impression / Assessment and Plan / ED Course  I have reviewed the triage vital signs and the nursing notes.  Pertinent labs & imaging results that were available during my care of the patient were reviewed by me and considered in my medical decision making (see chart for details).     Kiara Barrett is a  66 y.o. female here with R flank pain, back pain, dysuria. Likely renal colic vs pyelo. Will get labs, UA, CT renal stone.   12:45 PM UA + UTI. Cr 1.4, improved from 1.9 earlier in the year. WBC nl. CT renal stone showed no kidney stone but there are some gallstones but LFTs nl and I doubt acute chole. Likely early pyelo. Doesn't appear septic. Given rocephin, will dc home with keflex. Urine culture sent.   Final Clinical Impressions(s) / ED Diagnoses   Final diagnoses:  None    New Prescriptions New Prescriptions   No medications on file     Charlynne PanderYao, David Hsienta, MD 04/23/17 1246

## 2017-04-23 NOTE — Discharge Instructions (Signed)
You have a kidney infection. Take keflex three times daily for 10 days.   Take tylenol for pain.   Take vicodin for severe pain. DO NOT drive with it.   See your doctor  Return to ER if you have severe pain, vomiting, fever, dehydration, numbness, weakness

## 2017-04-23 NOTE — ED Notes (Signed)
Patient transported to CT 

## 2017-04-23 NOTE — ED Triage Notes (Signed)
Pt started to have some lower back pain two days ago but now its mostly on the right.  No radiation to leg.  Pt is having dysuria:  Frequency, burning and urgency.  No known fever.

## 2017-04-24 LAB — URINE CULTURE: Culture: NO GROWTH

## 2017-04-29 ENCOUNTER — Emergency Department (HOSPITAL_BASED_OUTPATIENT_CLINIC_OR_DEPARTMENT_OTHER): Payer: BLUE CROSS/BLUE SHIELD

## 2017-04-29 ENCOUNTER — Emergency Department (HOSPITAL_BASED_OUTPATIENT_CLINIC_OR_DEPARTMENT_OTHER)
Admission: EM | Admit: 2017-04-29 | Discharge: 2017-04-29 | Disposition: A | Discharge status: Home / Self Care | Payer: BLUE CROSS/BLUE SHIELD | Attending: Emergency Medicine | Admitting: Emergency Medicine

## 2017-04-29 ENCOUNTER — Encounter (HOSPITAL_BASED_OUTPATIENT_CLINIC_OR_DEPARTMENT_OTHER): Payer: Self-pay | Admitting: Emergency Medicine

## 2017-04-29 DIAGNOSIS — I10 Essential (primary) hypertension: Secondary | ICD-10-CM | POA: Diagnosis not present

## 2017-04-29 DIAGNOSIS — R1031 Right lower quadrant pain: Secondary | ICD-10-CM | POA: Diagnosis present

## 2017-04-29 DIAGNOSIS — E119 Type 2 diabetes mellitus without complications: Secondary | ICD-10-CM | POA: Diagnosis not present

## 2017-04-29 DIAGNOSIS — Z794 Long term (current) use of insulin: Secondary | ICD-10-CM | POA: Diagnosis not present

## 2017-04-29 DIAGNOSIS — R109 Unspecified abdominal pain: Secondary | ICD-10-CM

## 2017-04-29 LAB — CBC WITH DIFFERENTIAL/PLATELET
BASOS ABS: 0 10*3/uL (ref 0.0–0.1)
BASOS PCT: 0 %
Eosinophils Absolute: 0.1 10*3/uL (ref 0.0–0.7)
Eosinophils Relative: 2 %
HEMATOCRIT: 33.8 % — AB (ref 36.0–46.0)
HEMOGLOBIN: 11.4 g/dL — AB (ref 12.0–15.0)
LYMPHS PCT: 40 %
Lymphs Abs: 1.6 10*3/uL (ref 0.7–4.0)
MCH: 28.6 pg (ref 26.0–34.0)
MCHC: 33.7 g/dL (ref 30.0–36.0)
MCV: 84.7 fL (ref 78.0–100.0)
MONOS PCT: 12 %
Monocytes Absolute: 0.5 10*3/uL (ref 0.1–1.0)
NEUTROS ABS: 1.8 10*3/uL (ref 1.7–7.7)
NEUTROS PCT: 46 %
Platelets: 244 10*3/uL (ref 150–400)
RBC: 3.99 MIL/uL (ref 3.87–5.11)
RDW: 13.1 % (ref 11.5–15.5)
WBC: 4 10*3/uL (ref 4.0–10.5)

## 2017-04-29 LAB — COMPREHENSIVE METABOLIC PANEL WITH GFR
ALT: 28 U/L (ref 14–54)
AST: 39 U/L (ref 15–41)
Albumin: 3.6 g/dL (ref 3.5–5.0)
Alkaline Phosphatase: 73 U/L (ref 38–126)
Anion gap: 7 (ref 5–15)
BUN: 26 mg/dL — ABNORMAL HIGH (ref 6–20)
CO2: 27 mmol/L (ref 22–32)
Calcium: 9 mg/dL (ref 8.9–10.3)
Chloride: 103 mmol/L (ref 101–111)
Creatinine, Ser: 1.74 mg/dL — ABNORMAL HIGH (ref 0.44–1.00)
GFR calc Af Amer: 34 mL/min — ABNORMAL LOW
GFR calc non Af Amer: 29 mL/min — ABNORMAL LOW
Glucose, Bld: 300 mg/dL — ABNORMAL HIGH (ref 65–99)
Potassium: 4.5 mmol/L (ref 3.5–5.1)
Sodium: 137 mmol/L (ref 135–145)
Total Bilirubin: 0.3 mg/dL (ref 0.3–1.2)
Total Protein: 6.9 g/dL (ref 6.5–8.1)

## 2017-04-29 LAB — URINALYSIS, ROUTINE W REFLEX MICROSCOPIC
Bilirubin Urine: NEGATIVE
GLUCOSE, UA: 250 mg/dL — AB
Hgb urine dipstick: NEGATIVE
Ketones, ur: NEGATIVE mg/dL
LEUKOCYTES UA: NEGATIVE
NITRITE: NEGATIVE
PH: 5.5 (ref 5.0–8.0)
Protein, ur: >300 mg/dL — AB
SPECIFIC GRAVITY, URINE: 1.021 (ref 1.005–1.030)

## 2017-04-29 LAB — URINALYSIS, MICROSCOPIC (REFLEX): RBC / HPF: NONE SEEN RBC/hpf (ref 0–5)

## 2017-04-29 LAB — LIPASE, BLOOD: Lipase: 26 U/L (ref 11–51)

## 2017-04-29 MED ORDER — FENTANYL CITRATE (PF) 100 MCG/2ML IJ SOLN
50.0000 ug | Freq: Once | INTRAMUSCULAR | Status: AC
  Administered 2017-04-29: 50 ug via INTRAVENOUS
  Filled 2017-04-29: qty 2

## 2017-04-29 MED ORDER — ONDANSETRON HCL 4 MG/2ML IJ SOLN
4.0000 mg | Freq: Once | INTRAMUSCULAR | Status: AC
  Administered 2017-04-29: 4 mg via INTRAVENOUS
  Filled 2017-04-29: qty 2

## 2017-04-29 MED ORDER — HYDROCODONE-ACETAMINOPHEN 5-325 MG PO TABS: 1.0000 | ORAL_TABLET | Freq: Four times a day (QID) | ORAL | 0 refills | Status: DC | PRN

## 2017-04-29 NOTE — ED Triage Notes (Signed)
C.o R flank pain since 04/23/17. Was seen for same but is not having improvement. Denies urinary sx, or fever.

## 2017-04-29 NOTE — ED Notes (Addendum)
Awaiting U/S, resting quietly

## 2017-04-29 NOTE — ED Notes (Signed)
Patient transported to Ultrasound 

## 2017-04-29 NOTE — ED Notes (Signed)
ED Provider at bedside. 

## 2017-04-29 NOTE — ED Notes (Signed)
Returned to room.

## 2017-04-29 NOTE — ED Provider Notes (Addendum)
[facility] Provider Note   CSN: [id number] Arrival date & time: 04/29/17  [id number]     History   Chief Complaint Chief Complaint  Patient presents with  . Flank Pain    HPI Kiara Barrett is a 66 y.o. female.  Patient is a 66 year old female with a history of diabetes, hypertension and hyperlipidemia who presents with right back pain. She states this started about a week ago. She was seen in the emergency room on July 9 for similar pain. She states it's a constant achy pain that starts in her right mid back and radiates to her right midabdomen. It hurts more when she takes a deep breath but she denies any shortness of breath. She denies any cough or chest congestion. No fevers. No nausea or vomiting. No urinary symptoms. She states it feels similar to when she had a prior kidney stone. She states it hurts when she moves around. When she was seen on July 9 she had a CT scan of the abdomen and pelvis which showed no evidence of kidney stones but there was some gallstones. However given her otherwise normal labs and exam, it was felt that the pain was likely not related to the gallstones. She did have evidence of urinary tract infection and was started on antibiotics. She's been taking hydrocodone and Tylenol for symptomatic relief which has been helping until today. She denies any leg pain or swelling.      Past Medical History:  Diagnosis Date  . Diabetes mellitus without complication (HCC)   . Hyperlipemia   . Hypertension     There are no active problems to display for this patient.   Past Surgical History:  Procedure Laterality Date  . CESAREAN SECTION    . TUBAL LIGATION      OB History    No data available       Home Medications    Prior to Admission medications   Medication Sig Start Date End Date Taking? Authorizing Provider  amLODipine (NORVASC) 10 MG tablet Take 0.5 tablets (5 mg total) by mouth daily. 11/04/14   Marlon PelGreene, Tiffany, PA-C  atorvastatin  (LIPITOR) 20 MG tablet Take 20 mg by mouth daily.    [provider]  carvedilol (COREG) 6.25 MG tablet Take 6.25 mg by mouth 2 (two) times daily with a meal.    [provider]  cephALEXin (KEFLEX) 500 MG capsule Take 1 capsule (500 mg total) by mouth 3 (three) times daily. 04/23/17 05/03/17  Charlynne PanderYao, David Hsienta, MD  hydrochlorothiazide (MICROZIDE) 12.5 MG capsule Take 12.5 mg by mouth daily.    [provider]  HYDROcodone-acetaminophen (NORCO/VICODIN) 5-325 MG tablet Take 1 tablet by mouth every 6 (six) hours as needed. 04/29/17   Rolan BuccoBelfi, Amy Gothard, MD  insulin aspart (NOVOLOG) 100 UNIT/ML injection Inject into the skin 3 (three) times daily before meals.    [provider]  insulin glargine (LANTUS) 100 UNIT/ML injection Inject 45 Units into the skin 2 (two) times daily.    [provider]    Family History No family history on file.  Social History Social History  Substance Use Topics  . Smoking status: Never Smoker  . Smokeless tobacco: Never Used  . Alcohol use No     Allergies   Codeine   Review of Systems Review of Systems  Constitutional: Negative for chills, diaphoresis, fatigue and fever.  HENT: Negative for congestion, rhinorrhea and sneezing.   Eyes: Negative.   Respiratory: Negative for cough, chest tightness  and shortness of breath.   Cardiovascular: Negative for chest pain and leg swelling.  Gastrointestinal: Positive for abdominal pain. Negative for blood in stool, diarrhea, nausea and vomiting.  Genitourinary: Negative for difficulty urinating, flank pain, frequency and hematuria.  Musculoskeletal: Positive for back pain. Negative for arthralgias.  Skin: Negative for rash.  Neurological: Negative for dizziness, speech difficulty, weakness, numbness and headaches.     Physical Exam Updated Vital Signs BP (!) 152/76 (BP Location: Right Arm)   Pulse 66   Temp 98.5 F (36.9 C) (Oral)   Resp 16   Ht 5\' 5"  (1.651 m)    Wt 90.7 kg (200 lb)   SpO2 99%   BMI 33.28 kg/m   Physical Exam  Constitutional: She is oriented to person, place, and time. She appears well-developed and well-nourished.  HENT:  Head: Normocephalic and atraumatic.  Eyes: Pupils are equal, round, and reactive to light.  Neck: Normal range of motion. Neck supple.  Cardiovascular: Normal rate, regular rhythm and normal heart sounds.   Pulmonary/Chest: Effort normal and breath sounds normal. No respiratory distress. She has no wheezes. She has no rales. She exhibits no tenderness.  Abdominal: Soft. Bowel sounds are normal. There is tenderness. There is no rebound and no guarding.  Mild tenderness to the right midabdomen  Musculoskeletal: Normal range of motion. She exhibits no edema.  Positive tenderness to the right mid and lower back. No spinal tenderness. No pedal edema or calf tenderness  Lymphadenopathy:    She has no cervical adenopathy.  Neurological: She is alert and oriented to person, place, and time.  Skin: Skin is warm and dry. No rash noted.  Psychiatric: She has a normal mood and affect.     ED Treatments / Results  Labs (all labs ordered are listed, but only abnormal results are displayed) Labs Reviewed  COMPREHENSIVE METABOLIC PANEL - Abnormal; Notable for the following:       Result Value   Glucose, Bld 300 (*)    BUN 26 (*)    Creatinine, Ser 1.74 (*)    GFR calc non Af Amer 29 (*)    GFR calc Af Amer 34 (*)    All other components within normal limits  CBC WITH DIFFERENTIAL/PLATELET - Abnormal; Notable for the following:    Hemoglobin 11.4 (*)    HCT 33.8 (*)    All other components within normal limits  URINALYSIS, ROUTINE W REFLEX MICROSCOPIC - Abnormal; Notable for the following:    Glucose, UA 250 (*)    Protein, ur >300 (*)    All other components within normal limits  URINALYSIS, MICROSCOPIC (REFLEX) - Abnormal; Notable for the following:    Bacteria, UA MANY (*)    Squamous Epithelial / LPF 6-30  (*)    All other components within normal limits  LIPASE, BLOOD    EKG  EKG Interpretation None       Radiology US Abdomen Complete  Result Date: 04/29/2017 CLINICAL DATA:  Right flank pain EXAM: ABDOMEN ULTRASOUND COMPLETE COMPARISON:  None. FINDINGS: Gallbladder: Small stones, the largest 8 mm. Echogenic foci along the gallbladder wall, likely small polyps or possibly adenomyomatosis. Negative sonographic Murphy's. Common bile duct: Diameter: 5 mm in diameter Liver: Increased echotexture compatible with fatty infiltration. No focal abnormality or biliary ductal dilatation. Area of focal fatty sparing in the right lobe IVC: No abnormality visualized. Pancreas: Visualized portion unremarkable. Spleen: Size and appearance within normal limits. Right Kidney: Length: 9.5 cm. Echogenicity within normal limits. No  mass or hydronephrosis visualized. Left Kidney: Length: 10.1 cm. Echogenicity within normal limits. No mass or hydronephrosis visualized. Abdominal aorta: No aneurysm visualized. Other findings: None. IMPRESSION: Cholelithiasis.  Gallbladder wall polyps versus adenomyomatosis. Fatty liver. Electronically Signed   By: Charlett Nose M.D.   On: 04/29/2017 09:32    Procedures Procedures (including critical care time)  Medications Ordered in ED Medications  fentaNYL (SUBLIMAZE) injection 50 mcg (50 mcg Intravenous Given 04/29/17 0727)  ondansetron (ZOFRAN) injection 4 mg (4 mg Intravenous Given 04/29/17 0728)     Initial Impression / Assessment and Plan / ED Course  I have reviewed the triage vital signs and the nursing notes.  Pertinent labs & imaging results that were available during my care of the patient were reviewed by me and considered in my medical decision making (see chart for details).  Clinical Course as of Apr 29 952  Sun Apr 29, 2017  6045 Pt states that her pain is much better after one dose of Fentanyl  [MB]    Clinical Course User Index [MB] Rolan Bucco, MD     Patient presents with right flank pain. It seems to be more in her back. There is no neurologic deficits or signs of cauda equina. She did have some gallstones on her recent CTs so an ultrasound was performed which shows gallstones but no evidence of cholecystitis. There is a negative sonographic Murphy's sign. The pain is slightly pleuritic in nature at times but she has no other suggestions of pulmonary embolus. No tachycardia, no hypoxia, no reported shortness of breath, no unilateral leg pain or swelling. There is no evidence of kidney stones. Her urine does not appear to be infected. Her most recent culture was negative for bacteria. Also doesn't have any urinary symptoms. I feel this is more musculoskeletal in nature. She's neurologically intact. Her pain has improved with one dose of fentanyl and the ED. She's not requiring any further pain medication. She was discharged home in good condition. I gave her refill for a few more hydrocodone. I encouraged her to follow-up with her primary care physician. Return precautions were given.  Final Clinical Impressions(s) / ED Diagnoses   Final diagnoses:  Right flank pain    New Prescriptions Current Discharge Medication List       Rolan Bucco, MD 04/29/17 4098    Rolan Bucco, MD 04/29/17 [phone]

## 2018-06-18 ENCOUNTER — Encounter (HOSPITAL_BASED_OUTPATIENT_CLINIC_OR_DEPARTMENT_OTHER): Payer: Self-pay | Admitting: *Deleted

## 2018-06-18 ENCOUNTER — Other Ambulatory Visit: Payer: Self-pay

## 2018-06-18 ENCOUNTER — Emergency Department (HOSPITAL_BASED_OUTPATIENT_CLINIC_OR_DEPARTMENT_OTHER)
Admission: EM | Admit: 2018-06-18 | Discharge: 2018-06-18 | Disposition: A | Discharge status: Home / Self Care | Payer: BLUE CROSS/BLUE SHIELD | Attending: Emergency Medicine | Admitting: Emergency Medicine

## 2018-06-18 DIAGNOSIS — M25511 Pain in right shoulder: Secondary | ICD-10-CM | POA: Diagnosis not present

## 2018-06-18 DIAGNOSIS — Z794 Long term (current) use of insulin: Secondary | ICD-10-CM | POA: Insufficient documentation

## 2018-06-18 DIAGNOSIS — I10 Essential (primary) hypertension: Secondary | ICD-10-CM | POA: Insufficient documentation

## 2018-06-18 DIAGNOSIS — E119 Type 2 diabetes mellitus without complications: Secondary | ICD-10-CM | POA: Diagnosis not present

## 2018-06-18 DIAGNOSIS — Z79899 Other long term (current) drug therapy: Secondary | ICD-10-CM | POA: Diagnosis not present

## 2018-06-18 MED ORDER — DICLOFENAC SODIUM 1 % TD GEL: 2.0000 g | Freq: Four times a day (QID) | TRANSDERMAL | 0 refills | Status: AC

## 2018-06-18 MED FILL — DICLOFENAC SODIUM 1% GEL: 1 | 13 days supply | Qty: 100 | Fill #0

## 2018-06-18 NOTE — ED Triage Notes (Signed)
Pt reports pain to her right shoulder x 3 weeks, worse today. Pain worsens with moving shoulder and arm, tender to palp. Denies injury or trauma.

## 2018-06-18 NOTE — Discharge Instructions (Signed)
Take tylenol (2 extra strength pills) 3 times a day.  Use voltaren gel as needed for pain.  Use the sling for support and to help control your pain.  Use ice or heat on your shoulder for pain. Follow up with your primary care doctor if pain is not improving with this treatment in 1 week.  Return to the ER if you develop high fevers, numbness, loss of bowel or bladder control, or any new or concerning symptoms.

## 2018-06-18 NOTE — ED Provider Notes (Signed)
[facility] EMERGENCY DEPARTMENT Provider Note   CSN: [id number] Arrival date & time: 06/18/18  1152     History   Chief Complaint Chief Complaint  Patient presents with  . Shoulder Pain    HPI Kiara Barrett is a 67 y.o. female sent in for evaluation of right shoulder pain.  Patient states for the past 3 weeks, she has been having right shoulder pain.  Pain is worse with movement of her arm.  She denies fall, trauma, or injury.  It began when she woke up one morning.  She reports history of similar several years ago, in which she was treated with a sling, rest, told it was her ligaments.  She denies other issues with the shoulder in the past.  She denies radiation down her arm or into her back.  She denies neck pain.  Denies fevers, chills, numbness, or tingling.  She denies heavy lifting or manual labor.  Has been using Tylenol, 2 pills twice a day with mild improvement of her pain.  She has also been using icy hot patches without significant improvement.  She has not tried anything else.   HPI  Past Medical History:  Diagnosis Date  . Diabetes mellitus without complication (HCC)   . Hyperlipemia   . Hypertension     There are no active problems to display for this patient.   Past Surgical History:  Procedure Laterality Date  . CESAREAN SECTION    . TUBAL LIGATION       OB History   None      Home Medications    Prior to Admission medications   Medication Sig Start Date End Date Taking? Authorizing Provider  amLODipine (NORVASC) 10 MG tablet Take 0.5 tablets (5 mg total) by mouth daily. 11/04/14   Marlon Pel, PA-C  atorvastatin (LIPITOR) 20 MG tablet Take 20 mg by mouth daily.    [provider]  carvedilol (COREG) 6.25 MG tablet Take 6.25 mg by mouth 2 (two) times daily with a meal.    [provider]  diclofenac sodium (VOLTAREN) 1 % GEL Apply 2 g topically 4 (four) times daily. 06/18/18   Leanord Thibeau, PA-C    hydrochlorothiazide (MICROZIDE) 12.5 MG capsule Take 12.5 mg by mouth daily.    [provider]  HYDROcodone-acetaminophen (NORCO/VICODIN) 5-325 MG tablet Take 1 tablet by mouth every 6 (six) hours as needed. 04/29/17   Rolan Bucco, MD  insulin aspart (NOVOLOG) 100 UNIT/ML injection Inject into the skin 3 (three) times daily before meals.    [provider]  insulin glargine (LANTUS) 100 UNIT/ML injection Inject 45 Units into the skin 2 (two) times daily.    [provider]    Family History History reviewed. No pertinent family history.  Social History Social History   Tobacco Use  . Smoking status: Never Smoker  . Smokeless tobacco: Never Used  Substance Use Topics  . Alcohol use: No  . Drug use: No     Allergies   Codeine   Review of Systems Review of Systems  Musculoskeletal: Positive for arthralgias.  Neurological: Negative for numbness.  Hematological: Does not bruise/bleed easily.     Physical Exam Updated Vital Signs BP (!) 147/71 (BP Location: Left Arm)   Pulse 62   Temp 97.9 F (36.6 C)   Resp 16   Ht 5\' 5"  (1.651 m)   Wt 93 kg   SpO2 100%   BMI 34.11 kg/m   Physical Exam  Constitutional: She  is oriented to person, place, and time. She appears well-developed and well-nourished. No distress.  HENT:  Head: Normocephalic and atraumatic.  Eyes: EOM are normal.  Neck: Normal range of motion.  Pulmonary/Chest: Effort normal.  Abdominal: She exhibits no distension.  Musculoskeletal: Normal range of motion. She exhibits tenderness. She exhibits no edema or deformity.  No obvious swelling, warmth, or erythema of the right shoulder.  Tenderness to palpation of the trapezius and deltoid muscles.  No tenderness palpation over bony prominence.  Decreased active range of motion of the shoulder due to pain, full passive range of motion with decreased pain.  Radial pulses intact bilaterally.  Grip strength intact bilaterally.  No pain  with movement of the elbow or wrist.  No tenderness of the cervical spine.  Neurological: She is alert and oriented to person, place, and time. No sensory deficit.  Skin: Skin is warm. Capillary refill takes less than 2 seconds. No rash noted.  Psychiatric: She has a normal mood and affect.  Nursing note and vitals reviewed.    ED Treatments / Results  Labs (all labs ordered are listed, but only abnormal results are displayed) Labs Reviewed - No data to display  EKG None  Radiology No results found.  Procedures Procedures (including critical care time)  Medications Ordered in ED Medications - No data to display   Initial Impression / Assessment and Plan / ED Course  I have reviewed the triage vital signs and the nursing notes.  Pertinent labs & imaging results that were available during my care of the patient were reviewed by me and considered in my medical decision making (see chart for details).     Pt presenting for evaluation of right shoulder pain.  Physical exam reassuring, she is neurovascularly intact.  No erythema, warmth, fevers, chills.  Doubt septic joint.  No focal bony tenderness, or fall/trauma.  Doubt fracture or dislocation.  I do not believe x-rays would be beneficial today.  Patient reports history of similar pain due to ligamentous injury.  Likely similar etiology today.  Pain is reproducible with palpation of the musculature.  Will treat with sling, Voltaren gel, Tylenol.  Patient to follow-up with her PCP if symptoms are not improving.  At this time, patient appears a for discharge.  Return precautions given.  Patient states she understands and agrees plan.   Final Clinical Impressions(s) / ED Diagnoses   Final diagnoses:  Acute pain of right shoulder    ED Discharge Orders         Ordered    diclofenac sodium (VOLTAREN) 1 % GEL  4 times daily     06/18/18 1426           Shaheem Pichon, PA-C 06/18/18 1431    Alvira Monday,  MD 06/19/18 1459

## 2018-12-05 ENCOUNTER — Other Ambulatory Visit: Payer: Self-pay

## 2018-12-05 ENCOUNTER — Emergency Department (HOSPITAL_BASED_OUTPATIENT_CLINIC_OR_DEPARTMENT_OTHER): Payer: BLUE CROSS/BLUE SHIELD

## 2018-12-05 ENCOUNTER — Emergency Department (HOSPITAL_BASED_OUTPATIENT_CLINIC_OR_DEPARTMENT_OTHER)
Admission: EM | Admit: 2018-12-05 | Discharge: 2018-12-05 | Disposition: A | Discharge status: Home / Self Care | Payer: BLUE CROSS/BLUE SHIELD | Attending: Emergency Medicine | Admitting: Emergency Medicine

## 2018-12-05 ENCOUNTER — Encounter (HOSPITAL_BASED_OUTPATIENT_CLINIC_OR_DEPARTMENT_OTHER): Payer: Self-pay | Admitting: Emergency Medicine

## 2018-12-05 DIAGNOSIS — E162 Hypoglycemia, unspecified: Secondary | ICD-10-CM

## 2018-12-05 DIAGNOSIS — E11649 Type 2 diabetes mellitus with hypoglycemia without coma: Secondary | ICD-10-CM | POA: Diagnosis not present

## 2018-12-05 DIAGNOSIS — R400 Somnolence: Secondary | ICD-10-CM | POA: Diagnosis not present

## 2018-12-05 DIAGNOSIS — M79671 Pain in right foot: Secondary | ICD-10-CM | POA: Insufficient documentation

## 2018-12-05 DIAGNOSIS — Z794 Long term (current) use of insulin: Secondary | ICD-10-CM | POA: Insufficient documentation

## 2018-12-05 DIAGNOSIS — I10 Essential (primary) hypertension: Secondary | ICD-10-CM | POA: Insufficient documentation

## 2018-12-05 LAB — BASIC METABOLIC PANEL
ANION GAP: 5 (ref 5–15)
BUN: 30 mg/dL — ABNORMAL HIGH (ref 8–23)
CALCIUM: 9.3 mg/dL (ref 8.9–10.3)
CO2: 26 mmol/L (ref 22–32)
Chloride: 111 mmol/L (ref 98–111)
Creatinine, Ser: 1.4 mg/dL — ABNORMAL HIGH (ref 0.44–1.00)
GFR, EST AFRICAN AMERICAN: 45 mL/min — AB (ref >60)
GFR, EST NON AFRICAN AMERICAN: 39 mL/min — AB (ref >60)
GLUCOSE: 65 mg/dL — AB (ref 70–99)
POTASSIUM: 4.3 mmol/L (ref 3.5–5.1)
SODIUM: 142 mmol/L (ref 135–145)

## 2018-12-05 LAB — CBC WITH DIFFERENTIAL/PLATELET
ABS IMMATURE GRANULOCYTES: 0.01 10*3/uL (ref 0.00–0.07)
BASOS ABS: 0 10*3/uL (ref 0.0–0.1)
BASOS PCT: 0 %
EOS ABS: 0.1 10*3/uL (ref 0.0–0.5)
Eosinophils Relative: 2 %
HCT: 38.2 % (ref 36.0–46.0)
Hemoglobin: 11.6 g/dL — ABNORMAL LOW (ref 12.0–15.0)
IMMATURE GRANULOCYTES: 0 %
Lymphocytes Relative: 24 %
Lymphs Abs: 1.3 10*3/uL (ref 0.7–4.0)
MCH: 26.2 pg (ref 26.0–34.0)
MCHC: 30.4 g/dL (ref 30.0–36.0)
MCV: 86.4 fL (ref 80.0–100.0)
Monocytes Absolute: 0.4 10*3/uL (ref 0.1–1.0)
Monocytes Relative: 7 %
NEUTROS ABS: 3.7 10*3/uL (ref 1.7–7.7)
NEUTROS PCT: 67 %
PLATELETS: 227 10*3/uL (ref 150–400)
RBC: 4.42 MIL/uL (ref 3.87–5.11)
RDW: 14.8 % (ref 11.5–15.5)
WBC: 5.6 10*3/uL (ref 4.0–10.5)
nRBC: 0 % (ref 0.0–0.2)

## 2018-12-05 LAB — CBG MONITORING, ED
GLUCOSE-CAPILLARY: 50 mg/dL — AB (ref 70–99)
GLUCOSE-CAPILLARY: 47 mg/dL — AB (ref 70–99)
GLUCOSE-CAPILLARY: 234 mg/dL — AB (ref 70–99)
Glucose-Capillary: 180 mg/dL — ABNORMAL HIGH (ref 70–99)

## 2018-12-05 MED ORDER — DEXTROSE 50 % IV SOLN
1.0000 | Freq: Once | INTRAVENOUS | Status: AC
  Administered 2018-12-05: 50 mL via INTRAVENOUS
  Filled 2018-12-05: qty 50

## 2018-12-05 NOTE — ED Notes (Signed)
ED Provider at bedside. 

## 2018-12-05 NOTE — ED Notes (Signed)
Pt on monitor 

## 2018-12-05 NOTE — Discharge Instructions (Signed)
You were seen in the ED today with right toe pain. Your labs and x-ray was normal. Your blood sugar was low today. Be sure to eat after taking your insulin. Check your blood sugars through the afternoon and discuss your insulin dosing with your PCP. Return to the ED with any new or worsening symptoms.

## 2018-12-05 NOTE — ED Notes (Signed)
Up to BR , with assistance of 1,

## 2018-12-05 NOTE — ED Notes (Signed)
Given coke and graham crackers with peanut

## 2018-12-05 NOTE — ED Notes (Signed)
Blood glucose 47-EDP made aware.  Given orange juice, graham crackers, peanut butter.  New orders received from MD for D50.

## 2018-12-05 NOTE — ED Triage Notes (Addendum)
R foot pain x 2 weeks. Pt is slow to respond, not normal per daughter.

## 2018-12-05 NOTE — ED Provider Notes (Signed)
Emergency Department Provider Note   I have reviewed the triage vital signs and the nursing notes.   HISTORY  Chief Complaint Foot Pain and Hypoglycemia   HPI Kiara Barrett is a 68 y.o. female with PMH of DM, HLD, and HTN Zentz to the emergency department for evaluation of right, great toe pain.  Patient has had several days of symptoms is concerned about possible infection.  No drainage or bleeding from the toe.  No injury.  No pain in the foot or ankle.  She does have some discomfort in the posterior heel but no rash.  Patient drove herself to the emergency department.  Daughter met her here and noticed that she was more drowsy than normal.  Patient denies any unilateral weakness or numbness.  She has been compliant with her diabetes medications including Lantus.  She reports eating some chicken sausage this morning but her meal was not heavy.  Patient denies any unilateral weakness or numbness.   Past Medical History:  Diagnosis Date  . Diabetes mellitus without complication ([place])   . Hyperlipemia   . Hypertension     There are no active problems to display for this patient.   Past Surgical History:  Procedure Laterality Date  . CESAREAN SECTION    . TUBAL LIGATION      Allergies Codeine  No family history on file.  Social History Social History   Tobacco Use  . Smoking status: Never Smoker  . Smokeless tobacco: Never Used  Substance Use Topics  . Alcohol use: No  . Drug use: No    Review of Systems  Constitutional: No fever/chills. Positive drowsiness.  Eyes: No visual changes. ENT: No sore throat. Cardiovascular: Denies chest pain. Respiratory: Denies shortness of breath. Gastrointestinal: No abdominal pain.  No nausea, no vomiting.  No diarrhea.  No constipation. Genitourinary: Negative for dysuria. Musculoskeletal: Negative for back pain. Positive right toe pain.  Skin: Negative for rash. Neurological: Negative for headaches, focal weakness or  numbness.  10-point ROS otherwise negative.  ____________________________________________   PHYSICAL EXAM:  VITAL SIGNS: ED Triage Vitals  Enc Vitals Group     BP 12/05/18 1317 (!) 181/60     Pulse Rate 12/05/18 1317 (!) 51     Resp 12/05/18 1317 16     Temp 12/05/18 1355 97.8 F (36.6 C)     Temp Source 12/05/18 1355 Tympanic     SpO2 12/05/18 1317 100 %     Weight --      Height 12/05/18 1315 5' 5"  (1.651 m)     Pain Score 12/05/18 1315 8   Constitutional: Drowsy but provides a brief history. Well appearing and in no acute distress. Eyes: Conjunctivae are normal. PERRL.  Head: Atraumatic. Nose: No congestion/rhinnorhea. Mouth/Throat: Mucous membranes are moist.  Oropharynx non-erythematous. Neck: No stridor. Cardiovascular: Normal rate, regular rhythm. Good peripheral circulation. Grossly normal heart sounds.   Respiratory: Normal respiratory effort.  No retractions. Lungs CTAB. Gastrointestinal: Soft and nontender. No distention.  Musculoskeletal: No lower extremity tenderness nor edema. No gross deformities of extremities. Neurologic:  Normal speech and language. No gross focal neurologic deficits are appreciated. No pronator drift. Normal CN exam 2-12.  Skin:  Skin is warm, dry and intact. No rash noted.  ____________________________________________   LABS (all labs ordered are listed, but only abnormal results are displayed)  Labs Reviewed  CBC WITH DIFFERENTIAL/PLATELET - Abnormal; Notable for the following components:      Result Value   Hemoglobin 11.6 (*)  All other components within normal limits  BASIC METABOLIC PANEL - Abnormal; Notable for the following components:   Glucose, Bld 65 (*)    BUN 30 (*)    Creatinine, Ser 1.40 (*)    GFR calc non Af Amer 39 (*)    GFR calc Af Amer 45 (*)    All other components within normal limits  CBG MONITORING, ED - Abnormal; Notable for the following components:   Glucose-Capillary 50 (*)    All other  components within normal limits  CBG MONITORING, ED - Abnormal; Notable for the following components:   Glucose-Capillary 47 (*)    All other components within normal limits  CBG MONITORING, ED - Abnormal; Notable for the following components:   Glucose-Capillary 180 (*)    All other components within normal limits  CBG MONITORING, ED - Abnormal; Notable for the following components:   Glucose-Capillary 234 (*)    All other components within normal limits   ____________________________________________  RADIOLOGY  Dg Foot Complete Right  Result Date: 12/05/2018 CLINICAL DATA:  Right foot pain for 2 months without known injury. EXAM: RIGHT FOOT COMPLETE - 3+ VIEW COMPARISON:  Radiographs of October 24, 2005. FINDINGS: There is no evidence of fracture or dislocation. There is no evidence of arthropathy or other focal bone abnormality. Soft tissues are unremarkable. IMPRESSION: Negative. Electronically Signed   By: Marijo Conception, M.D.   On: 12/05/2018 14:21    ____________________________________________   PROCEDURES  Procedure(s) performed:   Procedures  None ____________________________________________   INITIAL IMPRESSION / ASSESSMENT AND PLAN / ED COURSE  Pertinent labs & imaging results that were available during my care of the patient were reviewed by me and considered in my medical decision making (see chart for details).  Patient presents to the emergency department with right toe pain.  No sign of infection, hematoma.  Patient does have abnormal shape to the toe but no clear ingrown toenail.  I advised that she will need to follow with a podiatrist for nail evaluation.  No indication for antibiotics.  Lab testing and x-ray are normal.   Patient's drowsiness is likely related to hypoglycemia.  Initially, patient was given soda and crackers but blood sugar not significantly improved.  She was given D50 and additional food to eat.  We will continue to follow CBG.  Patient  with a nonfocal neurologic exam.   Care transferred to Dr. Ronnald Nian pending repeat CBG.  Patient is much more awake and alert with blood sugar of 180 at this time.  Neuro exam remains nonfocal.  Advised podiatry follow-up.  Given her Labrea Eccleston-acting insulin at home I will keep in the emergency department for observation for 1 more hour.  ____________________________________________  FINAL CLINICAL IMPRESSION(S) / ED DIAGNOSES  Final diagnoses:  Right foot pain  Hypoglycemia     MEDICATIONS GIVEN DURING THIS VISIT:  Medications  dextrose 50 % solution 50 mL (50 mLs Intravenous Given 12/05/18 1431)    Note:  This document was prepared using Dragon voice recognition software and may include unintentional dictation errors.  Nanda Quinton, MD Emergency Medicine    Setareh Rom, [facility], MD 12/05/18 [phone]

## 2018-12-05 NOTE — ED Provider Notes (Signed)
Repeat blood sugar has improved.  Hypoglycemia likely secondary to not eating after taking her morning insulin.  Has follow-up with podiatry in place for evaluation for foot wound.  Discharged in good condition.   Virgina Norfolk, DO 12/05/18 1624

## 2018-12-11 ENCOUNTER — Emergency Department (HOSPITAL_BASED_OUTPATIENT_CLINIC_OR_DEPARTMENT_OTHER)
Admission: EM | Admit: 2018-12-11 | Discharge: 2018-12-11 | Disposition: A | Discharge status: Home / Self Care | Payer: BLUE CROSS/BLUE SHIELD | Attending: Emergency Medicine | Admitting: Emergency Medicine

## 2018-12-11 ENCOUNTER — Encounter (HOSPITAL_BASED_OUTPATIENT_CLINIC_OR_DEPARTMENT_OTHER): Payer: Self-pay | Admitting: *Deleted

## 2018-12-11 ENCOUNTER — Other Ambulatory Visit: Payer: Self-pay

## 2018-12-11 DIAGNOSIS — I10 Essential (primary) hypertension: Secondary | ICD-10-CM | POA: Insufficient documentation

## 2018-12-11 DIAGNOSIS — E119 Type 2 diabetes mellitus without complications: Secondary | ICD-10-CM | POA: Insufficient documentation

## 2018-12-11 DIAGNOSIS — B351 Tinea unguium: Secondary | ICD-10-CM | POA: Diagnosis not present

## 2018-12-11 DIAGNOSIS — Z794 Long term (current) use of insulin: Secondary | ICD-10-CM | POA: Insufficient documentation

## 2018-12-11 DIAGNOSIS — Z79899 Other long term (current) drug therapy: Secondary | ICD-10-CM | POA: Diagnosis not present

## 2018-12-11 DIAGNOSIS — M79609 Pain in unspecified limb: Secondary | ICD-10-CM

## 2018-12-11 DIAGNOSIS — M79674 Pain in right toe(s): Secondary | ICD-10-CM | POA: Diagnosis present

## 2018-12-11 LAB — CBC WITH DIFFERENTIAL/PLATELET
ABS IMMATURE GRANULOCYTES: 0.01 10*3/uL (ref 0.00–0.07)
BASOS ABS: 0 10*3/uL (ref 0.0–0.1)
Basophils Relative: 0 %
EOS PCT: 2 %
Eosinophils Absolute: 0.1 10*3/uL (ref 0.0–0.5)
HEMATOCRIT: 36.8 % (ref 36.0–46.0)
Hemoglobin: 11.2 g/dL — ABNORMAL LOW (ref 12.0–15.0)
Immature Granulocytes: 0 %
LYMPHS ABS: 1.8 10*3/uL (ref 0.7–4.0)
LYMPHS PCT: 31 %
MCH: 26.4 pg (ref 26.0–34.0)
MCHC: 30.4 g/dL (ref 30.0–36.0)
MCV: 86.6 fL (ref 80.0–100.0)
MONO ABS: 0.5 10*3/uL (ref 0.1–1.0)
Monocytes Relative: 8 %
NEUTROS ABS: 3.4 10*3/uL (ref 1.7–7.7)
NRBC: 0 % (ref 0.0–0.2)
Neutrophils Relative %: 59 %
Platelets: 242 10*3/uL (ref 150–400)
RBC: 4.25 MIL/uL (ref 3.87–5.11)
RDW: 14.9 % (ref 11.5–15.5)
WBC: 5.8 10*3/uL (ref 4.0–10.5)

## 2018-12-11 LAB — COMPREHENSIVE METABOLIC PANEL
ALT: 15 U/L (ref 0–44)
AST: 17 U/L (ref 15–41)
Albumin: 3.9 g/dL (ref 3.5–5.0)
Alkaline Phosphatase: 75 U/L (ref 38–126)
Anion gap: 6 (ref 5–15)
BUN: 24 mg/dL — AB (ref 8–23)
CHLORIDE: 112 mmol/L — AB (ref 98–111)
CO2: 23 mmol/L (ref 22–32)
CREATININE: 1.5 mg/dL — AB (ref 0.44–1.00)
Calcium: 8.9 mg/dL (ref 8.9–10.3)
GFR calc Af Amer: 41 mL/min — ABNORMAL LOW (ref >60)
GFR calc non Af Amer: 36 mL/min — ABNORMAL LOW (ref >60)
Glucose, Bld: 95 mg/dL (ref 70–99)
POTASSIUM: 4 mmol/L (ref 3.5–5.1)
SODIUM: 141 mmol/L (ref 135–145)
Total Bilirubin: 0.4 mg/dL (ref 0.3–1.2)
Total Protein: 7.3 g/dL (ref 6.5–8.1)

## 2018-12-11 MED ORDER — TERBINAFINE HCL 250 MG PO TABS: 250.0000 mg | ORAL_TABLET | Freq: Every day | ORAL | 0 refills | Status: AC

## 2018-12-11 MED ORDER — ACETAMINOPHEN 325 MG PO TABS
650.0000 mg | ORAL_TABLET | Freq: Once | ORAL | Status: AC
  Administered 2018-12-11: 650 mg via ORAL
  Filled 2018-12-11: qty 2

## 2018-12-11 NOTE — ED Triage Notes (Signed)
Pt c/o bil big toe nail infection x 3 weeks

## 2018-12-11 NOTE — Discharge Instructions (Addendum)
You have been seen today for nail pain. Please read and follow all provided instructions.   1. Medications: terbinafine (anti fungal medication), usual home medications 2. Treatment: rest, drink plenty of fluids 3. Follow Up: Please follow up with your primary doctor in 7 days for discussion of your diagnoses and further evaluation after today's visit; if you do not have a primary care doctor use the resource guide provided to find one; Please return to the ER for any new or worsening symptoms. Please obtain all of your results from medical records or have your doctors office obtain the results - share them with your doctor - you should be seen at your doctors office. Call today to arrange your follow up.   Take medications as prescribed. Please review all of the medicines and only take them if you do not have an allergy to them. Return to the emergency room for worsening condition or new concerning symptoms. Follow up with your regular doctor. If you don't have a regular doctor use one of the numbers below to establish a primary care doctor.  Please be aware that if you are taking birth control pills, taking other prescriptions, ESPECIALLY ANTIBIOTICS may make the birth control ineffective - if this is the case, either do not engage in sexual activity or use alternative methods of birth control such as condoms until you have finished the medicine and your family doctor says it is OK to restart them. If you are on a blood thinner such as COUMADIN, be aware that any other medicine that you take may cause the coumadin to either work too much, or not enough - you should have your coumadin level rechecked in next 7 days if this is the case.  ?  It is also a possibility that you have an allergic reaction to any of the medicines that you have been prescribed - Everybody reacts differently to medications and while MOST people have no trouble with most medicines, you may have a reaction such as nausea, vomiting,  rash, swelling, shortness of breath. If this is the case, please stop taking the medicine immediately and contact your physician.  ?  You should return to the ER if you develop severe or worsening symptoms.   Emergency Department Resource Guide 1) Find a Doctor and Pay Out of Pocket Although you won't have to find out who is covered by your insurance plan, it is a good idea to ask around and get recommendations. You will then need to call the office and see if the doctor you have chosen will accept you as a new patient and what types of options they offer for patients who are self-pay. Some doctors offer discounts or will set up payment plans for their patients who do not have insurance, but you will need to ask so you aren't surprised when you get to your appointment.  2) Contact Your Local Health Department Not all health departments have doctors that can see patients for sick visits, but many do, so it is worth a call to see if yours does. If you don't know where your local health department is, you can check in your phone book. The CDC also has a tool to help you locate your state's health department, and many state websites also have listings of all of their local health departments.  3) Find a Walk-in Clinic If your illness is not likely to be very severe or complicated, you may want to try a walk in clinic. These are  popping up all over the country in pharmacies, drugstores, and shopping centers. They're usually staffed by nurse practitioners or physician assistants that have been trained to treat common illnesses and complaints. They're usually fairly quick and inexpensive. However, if you have serious medical issues or chronic medical problems, these are probably not your best option.  No Primary Care Doctor: Call Health Connect at  [phone] - they can help you locate a primary care doctor that  accepts your insurance, provides certain services, etc. Physician Referral [phone]  Emergency Department Resource Guide 1) Find a Doctor and Pay Out of Pocket Although you won't have to find out who is covered by your insurance plan, it is a good idea to ask around and get recommendations. You will then need to call the office and see if the doctor you have chosen will accept you as a new patient and what types of options they offer for patients who are self-pay. Some doctors offer discounts or will set up payment plans for their patients who do not have insurance, but you will need to ask so you aren't surprised when you get to your appointment.  2) Contact Your Local Health Department Not all health departments have doctors that can see patients for sick visits, but many do, so it is worth a call to see if yours does. If you don't know where your local health department is, you can check in your phone book. The CDC also has a tool to help you locate your state's health department, and many state websites also have listings of all of their local health departments.  3) Find a [facility] If your illness is not likely to be very severe or complicated, you may want to try a walk in clinic. These are popping up all over the country in pharmacies, drugstores, and shopping centers. They're usually staffed by nurse practitioners or physician assistants that have been trained to treat common illnesses and complaints. They're usually fairly quick and inexpensive. However, if you have serious medical issues or chronic medical problems, these are probably not your best option.  No Primary Care Doctor: Call Health Connect at  [phone] - they can help you locate a primary care doctor that  accepts your insurance, provides certain services, etc. Physician Referral Service- [phone]  Chronic Pain Problems: Organization         Address  Phone   Notes  [facility]  [phone] Patients need to be referred by their primary care doctor.    Medication Assistance: Organization         Address  Phone   Notes  [facility] Medication [facility] [address] [place], [place] [address] [phone] --Must be a resident of [facility] -- Must have NO insurance coverage whatsoever (no Medicaid/ Medicare, etc.) -- The pt. MUST have a primary care doctor that directs their care regularly and follows them in the community   [facility]  [phone]   [employer]  [phone]    Agencies that provide inexpensive medical care: Organization         Address  Phone   Notes  [place]  [phone]   [facility]    [phone]   [facility], [place] [address] [phone]   [place] [address] [place] [phone]   [facility]    [phone])  [facility]    [phone]   [address], [place] Phone:  [phone], Fax:  [phone] Hours of Operation:  9 am - 6 pm, M-F.  Also accepts Medicaid/Medicare and self-pay.  [facility] for [place] [address], [address], [place] Phone: [phone], Fax: [phone]. Hours of Operation:  8:30 am - 5:30 pm, M-F.  Also accepts Medicaid and self-pay.  [facility] [address] [place] Phone: [phone]   [place], [place], [place] [phone], Ext. [phone] Mondays & Thursdays: 7-9 AM.  First 15 patients are seen on a first come, first serve basis.    [place] Providers:  Organization         Address  Phone   Notes  [facility] [address], [place] [phone] Also accepts self-pay patients.  [facility] [address], [place]  [phone]   [place], [address], [place] [phone]   [facility] [address], [place] [phone]   Lucianne Lei [address], [place]   [phone] Only accepts [place] patients after they have their name applied to their card.   Self-Pay (no insurance) in [facility]:  Organization         Address  Phone   Notes  Sickle Cell Patients, [facility] [place] [phone]   [facility] [place] [phone]   [facility] [place]  [place], [place], [place] [phone]   [facility]/Dr. Osei-Bonsu  [address], [place] or [address], [place] [phone] Phone number for both [place] and [place] locations is the same.  [facility] and [facility] [address] [place] [phone]   [facility] [address] [place] or [address] [phone]   [facility] [address], [place] [phone], phone; [phone], fax Sees patients 1st and 3rd Saturday of every month.  Must not qualify for public or private insurance (i.e. Medicaid, Medicare, [place] [employer], Veterans' Benefits)  Household income should be no more than 200% of the poverty level The clinic cannot treat you if you are pregnant or think you are pregnant  Sexually transmitted diseases are not treated at the clinic.

## 2018-12-11 NOTE — ED Provider Notes (Signed)
[facility] EMERGENCY DEPARTMENT Provider Note   CSN: [id number] Arrival date & time: 12/11/18  1726    History   Chief Complaint Chief Complaint  Patient presents with  . Nail Problem    HPI Kiara Barrett is a 68 y.o. female with a PMH of Diabetes, HTN, and HLD presenting with intermittent right big toenail pain onset 3 weeks ago. Patient reports bilateral big toenails are tender, but states it is worse on the right toenail. Patient reports pain is worse with palpation and walking. Patient describes pain as an ache. Patient reports taking tylenol with partial relief. Patient denies erythema, edema, or wounds. Patient denies fever, chills, nausea, vomiting, or abdominal pain. Patient denies injury. Patient reports she was evaluated 6 days ago in the ER for similar symptoms and advised to follow up with podiatry. Patient has not followed up with podiatry recently. Patient states she saw a podiatrist a year ago. Patient had hypoglycemia during the last ER visit and states she saw her endocrinologist and have adjusted her insulin. Patient states symptoms started after getting a pedicure 3 weeks ago.      HPI  Past Medical History:  Diagnosis Date  . Diabetes mellitus without complication (HCC)   . Hyperlipemia   . Hypertension     There are no active problems to display for this patient.   Past Surgical History:  Procedure Laterality Date  . CESAREAN SECTION    . TUBAL LIGATION       OB History   No obstetric history on file.      Home Medications    Prior to Admission medications   Medication Sig Start Date End Date Taking? Authorizing Provider  amLODipine (NORVASC) 10 MG tablet Take 0.5 tablets (5 mg total) by mouth daily. 11/04/14   Marlon Pel, PA-C  atorvastatin (LIPITOR) 20 MG tablet Take 20 mg by mouth daily.    [provider]  carvedilol (COREG) 6.25 MG tablet Take 6.25 mg by mouth 2 (two) times daily with a meal.    [provider]  diclofenac sodium (VOLTAREN) 1 % GEL Apply 2 g topically 4 (four) times daily. 06/18/18   Caccavale, Sophia, PA-C  hydrochlorothiazide (MICROZIDE) 12.5 MG capsule Take 12.5 mg by mouth daily.    [provider]  HYDROcodone-acetaminophen (NORCO/VICODIN) 5-325 MG tablet Take 1 tablet by mouth every 6 (six) hours as needed. 04/29/17   Rolan Bucco, MD  insulin aspart (NOVOLOG) 100 UNIT/ML injection Inject into the skin 3 (three) times daily before meals.    [provider]  insulin glargine (LANTUS) 100 UNIT/ML injection Inject 45 Units into the skin 2 (two) times daily.    [provider]  terbinafine (LAMISIL) 250 MG tablet Take 1 tablet (250 mg total) by mouth daily for 14 days. 12/11/18 12/25/18  Leretha Dykes, PA-C    Family History History reviewed. No pertinent family history.  Social History Social History   Tobacco Use  . Smoking status: Never Smoker  . Smokeless tobacco: Never Used  Substance Use Topics  . Alcohol use: No  . Drug use: No     Allergies   Codeine   Review of Systems Review of Systems  Constitutional: Negative for chills, diaphoresis and fever.  Respiratory: Negative for cough and shortness of breath.   Cardiovascular: Negative for chest pain.  Gastrointestinal: Negative for abdominal pain, nausea and vomiting.  Endocrine: Negative for polydipsia, polyphagia and polyuria.  Genitourinary: Negative for dysuria.  Musculoskeletal: Negative for  arthralgias, back pain, gait problem and joint swelling.       Pt reports nail pain.  Skin: Negative for color change, rash and wound.  Hematological: Negative for adenopathy.     Physical Exam Updated Vital Signs BP (!) 171/54 (BP Location: Left Arm)   Pulse (!) 58   Temp 98.5 F (36.9 C)   Resp 16   Ht 5\' 5"  (1.651 m)   Wt 91.6 kg   SpO2 100%   BMI 33.61 kg/m   Physical Exam Vitals signs and nursing note reviewed.  Constitutional:      General: She is not  in acute distress.    Appearance: She is well-developed. She is not diaphoretic.  HENT:     Head: Normocephalic and atraumatic.  Cardiovascular:     Rate and Rhythm: Normal rate and regular rhythm.     Heart sounds: Normal heart sounds. No murmur. No friction rub. No gallop.   Pulmonary:     Effort: Pulmonary effort is normal. No respiratory distress.     Breath sounds: Normal breath sounds. No wheezing or rales.  Abdominal:     Palpations: Abdomen is soft.     Tenderness: There is no abdominal tenderness.  Musculoskeletal: Normal range of motion.     Right ankle: Normal. She exhibits normal range of motion, no swelling and no ecchymosis.     Left ankle: Normal. She exhibits normal range of motion, no swelling and no ecchymosis.     Right foot: Normal range of motion. Tenderness present. No bony tenderness or swelling.     Left foot: Normal range of motion. Tenderness present. No bony tenderness or swelling.       Feet:  Skin:    Findings: No erythema or rash.  Neurological:     Mental Status: She is alert and oriented to person, place, and time.    ED Treatments / Results  Labs (all labs ordered are listed, but only abnormal results are displayed) Labs Reviewed  COMPREHENSIVE METABOLIC PANEL - Abnormal; Notable for the following components:      Result Value   Chloride 112 (*)    BUN 24 (*)    Creatinine, Ser 1.50 (*)    GFR calc non Af Amer 36 (*)    GFR calc Af Amer 41 (*)    All other components within normal limits  CBC WITH DIFFERENTIAL/PLATELET - Abnormal; Notable for the following components:   Hemoglobin 11.2 (*)    All other components within normal limits    EKG None  Radiology No results found.  Procedures Procedures (including critical care time)  Medications Ordered in ED Medications  acetaminophen (TYLENOL) tablet 650 mg (650 mg Oral Given 12/11/18 1840)     Initial Impression / Assessment and Plan / ED Course  I have reviewed the triage vital  signs and the nursing notes.  Pertinent labs & imaging results that were available during my care of the patient were reviewed by me and considered in my medical decision making (see chart for details).  Clinical Course as of Dec 12 1999  Wed Dec 11, 2018  1858 Hemoglobin noted at 11.2. This appears to be patient's baseline.  Hemoglobin(!): 11.2 [AH]  1859 WBCs are within normal limits.  WBC: 5.8 [AH]  1922 Creatinine elevated and GFR low. This appears to be baseline when compared to previous values.  Creatinine(!): 1.50 [AH]    Clinical Course User Index [AH] Leretha Dykes, PA-C  Patient presents with toenail pain. Suspect onychomycosis based on physical exam. Patient had an x ray performed a few days ago and it was negative. Patient denies any trauma. No signs of infection at this time. Will treat with a short course of Terbinafine and advise patient to follow up with podiatrist. Liver enzymes are within normal limits at this time. Discussed patient will require a long course of Terbinafine, but advised patient to follow up with PCP or podiatry for further treatment due to continuity of care. Discussed elevated BP during today's visit. Patient denies any symptoms of hypertensive crisis. Advised patient to follow up with PCP. Patient is stable and will be discharged home.   Final Clinical Impressions(s) / ED Diagnoses   Final diagnoses:  Pain due to onychomycosis of nail    ED Discharge Orders         Ordered    terbinafine (LAMISIL) 250 MG tablet  Daily     12/11/18 1938           Leretha Dykes, [place] 12/11/18 Jennefer Bravo, MD 12/11/18 2316

## 2019-11-06 ENCOUNTER — Emergency Department (HOSPITAL_BASED_OUTPATIENT_CLINIC_OR_DEPARTMENT_OTHER)
Admission: EM | Admit: 2019-11-06 | Discharge: 2019-11-06 | Disposition: A | Discharge status: Home / Self Care | Payer: BC Managed Care – PPO | Attending: Emergency Medicine | Admitting: Emergency Medicine

## 2019-11-06 ENCOUNTER — Emergency Department (HOSPITAL_BASED_OUTPATIENT_CLINIC_OR_DEPARTMENT_OTHER): Payer: BC Managed Care – PPO

## 2019-11-06 ENCOUNTER — Other Ambulatory Visit: Payer: Self-pay

## 2019-11-06 ENCOUNTER — Encounter (HOSPITAL_BASED_OUTPATIENT_CLINIC_OR_DEPARTMENT_OTHER): Payer: Self-pay | Admitting: *Deleted

## 2019-11-06 DIAGNOSIS — U071 COVID-19: Secondary | ICD-10-CM | POA: Diagnosis not present

## 2019-11-06 DIAGNOSIS — Z794 Long term (current) use of insulin: Secondary | ICD-10-CM | POA: Diagnosis not present

## 2019-11-06 DIAGNOSIS — Z79899 Other long term (current) drug therapy: Secondary | ICD-10-CM | POA: Insufficient documentation

## 2019-11-06 DIAGNOSIS — N12 Tubulo-interstitial nephritis, not specified as acute or chronic: Secondary | ICD-10-CM | POA: Diagnosis not present

## 2019-11-06 DIAGNOSIS — E119 Type 2 diabetes mellitus without complications: Secondary | ICD-10-CM | POA: Diagnosis not present

## 2019-11-06 DIAGNOSIS — R509 Fever, unspecified: Secondary | ICD-10-CM | POA: Diagnosis present

## 2019-11-06 DIAGNOSIS — I10 Essential (primary) hypertension: Secondary | ICD-10-CM | POA: Diagnosis not present

## 2019-11-06 LAB — COMPREHENSIVE METABOLIC PANEL
ALT: 22 U/L (ref 0–44)
AST: 26 U/L (ref 15–41)
Albumin: 3.9 g/dL (ref 3.5–5.0)
Alkaline Phosphatase: 61 U/L (ref 38–126)
Anion gap: 8 (ref 5–15)
BUN: 27 mg/dL — ABNORMAL HIGH (ref 8–23)
CO2: 23 mmol/L (ref 22–32)
Calcium: 8.7 mg/dL — ABNORMAL LOW (ref 8.9–10.3)
Chloride: 106 mmol/L (ref 98–111)
Creatinine, Ser: 1.97 mg/dL — ABNORMAL HIGH (ref 0.44–1.00)
GFR calc Af Amer: 30 mL/min — ABNORMAL LOW (ref >60)
GFR calc non Af Amer: 25 mL/min — ABNORMAL LOW (ref >60)
Glucose, Bld: 94 mg/dL (ref 70–99)
Potassium: 4.2 mmol/L (ref 3.5–5.1)
Sodium: 137 mmol/L (ref 135–145)
Total Bilirubin: 0.5 mg/dL (ref 0.3–1.2)
Total Protein: 7.9 g/dL (ref 6.5–8.1)

## 2019-11-06 LAB — URINALYSIS, ROUTINE W REFLEX MICROSCOPIC
Bilirubin Urine: NEGATIVE
Glucose, UA: NEGATIVE mg/dL
Ketones, ur: NEGATIVE mg/dL
Leukocytes,Ua: NEGATIVE
Nitrite: NEGATIVE
Protein, ur: 100 mg/dL — AB
Specific Gravity, Urine: 1.02 (ref 1.005–1.030)
pH: 7 (ref 5.0–8.0)

## 2019-11-06 LAB — CBC WITH DIFFERENTIAL/PLATELET
Abs Immature Granulocytes: 0.01 10*3/uL (ref 0.00–0.07)
Basophils Absolute: 0 10*3/uL (ref 0.0–0.1)
Basophils Relative: 0 %
Eosinophils Absolute: 0.1 10*3/uL (ref 0.0–0.5)
Eosinophils Relative: 1 %
HCT: 37.1 % (ref 36.0–46.0)
Hemoglobin: 11.5 g/dL — ABNORMAL LOW (ref 12.0–15.0)
Immature Granulocytes: 0 %
Lymphocytes Relative: 20 %
Lymphs Abs: 0.9 10*3/uL (ref 0.7–4.0)
MCH: 26.6 pg (ref 26.0–34.0)
MCHC: 31 g/dL (ref 30.0–36.0)
MCV: 85.9 fL (ref 80.0–100.0)
Monocytes Absolute: 0.3 10*3/uL (ref 0.1–1.0)
Monocytes Relative: 7 %
Neutro Abs: 3.1 10*3/uL (ref 1.7–7.7)
Neutrophils Relative %: 72 %
Platelets: 224 10*3/uL (ref 150–400)
RBC: 4.32 MIL/uL (ref 3.87–5.11)
RDW: 14.3 % (ref 11.5–15.5)
WBC: 4.3 10*3/uL (ref 4.0–10.5)
nRBC: 0 % (ref 0.0–0.2)

## 2019-11-06 LAB — LIPASE, BLOOD: Lipase: 41 U/L (ref 11–51)

## 2019-11-06 LAB — URINALYSIS, MICROSCOPIC (REFLEX)

## 2019-11-06 LAB — RESPIRATORY PANEL BY RT PCR (FLU A&B, COVID)
Influenza A by PCR: NEGATIVE
Influenza B by PCR: NEGATIVE
SARS Coronavirus 2 by RT PCR: POSITIVE — AB

## 2019-11-06 MED ORDER — SODIUM CHLORIDE 0.9 % IV SOLN
1.0000 g | Freq: Once | INTRAVENOUS | Status: AC
  Administered 2019-11-06: 1 g via INTRAVENOUS
  Filled 2019-11-06: qty 10

## 2019-11-06 MED ORDER — CEPHALEXIN 500 MG PO CAPS: 500.0000 mg | ORAL_CAPSULE | Freq: Three times a day (TID) | ORAL | 0 refills | Status: AC

## 2019-11-06 MED ORDER — LACTATED RINGERS IV BOLUS
1000.0000 mL | Freq: Once | INTRAVENOUS | Status: AC
  Administered 2019-11-06: 18:00:00 1000 mL via INTRAVENOUS

## 2019-11-06 MED ORDER — HYDROMORPHONE HCL 1 MG/ML IJ SOLN
0.5000 mg | Freq: Once | INTRAMUSCULAR | Status: AC
  Administered 2019-11-06: 18:00:00 0.5 mg via INTRAVENOUS
  Filled 2019-11-06: qty 1

## 2019-11-06 MED ORDER — ONDANSETRON HCL 4 MG/2ML IJ SOLN
4.0000 mg | Freq: Once | INTRAMUSCULAR | Status: AC
  Administered 2019-11-06: 4 mg via INTRAVENOUS
  Filled 2019-11-06: qty 2

## 2019-11-06 MED ORDER — IOHEXOL 300 MG/ML  SOLN
100.0000 mL | Freq: Once | INTRAMUSCULAR | Status: AC | PRN
  Administered 2019-11-06: 60 mL via INTRAVENOUS

## 2019-11-06 NOTE — ED Provider Notes (Signed)
[facility] EMERGENCY DEPARTMENT Provider Note   CSN: [id number] Arrival date & time: 11/06/19  1719     History Chief Complaint  Patient presents with  . Back Pain    Kiara Barrett is a 69 y.o. female.  HPI   69 year old female with fevers and chills.  Onset 3 days ago.  Persistent since then.  In the last days developed some right mid back pain and urinary frequency.  Nausea.  No vomiting.  Some loose stools.  No coughing or shortness of breath.  No sick contacts that she is aware of.  Past Medical History:  Diagnosis Date  . Diabetes mellitus without complication ([place])   . Hyperlipemia   . Hypertension     There are no problems to display for this patient.   Past Surgical History:  Procedure Laterality Date  . CESAREAN SECTION    . TUBAL LIGATION       OB History   No obstetric history on file.     No family history on file.  Social History   Tobacco Use  . Smoking status: Never Smoker  . Smokeless tobacco: Never Used  Substance Use Topics  . Alcohol use: No  . Drug use: No    Home Medications Prior to Admission medications   Medication Sig Start Date End Date Taking? Authorizing Provider  amLODipine (NORVASC) 10 MG tablet Take 0.5 tablets (5 mg total) by mouth daily. 11/04/14  Yes Carlota Raspberry, Tiffany, PA-C  atorvastatin (LIPITOR) 20 MG tablet Take 20 mg by mouth daily.   Yes [provider]  carvedilol (COREG) 6.25 MG tablet Take 6.25 mg by mouth 2 (two) times daily with a meal.   Yes [provider]  hydrochlorothiazide (MICROZIDE) 12.5 MG capsule Take 12.5 mg by mouth daily.   Yes [provider]  insulin aspart (NOVOLOG) 100 UNIT/ML injection Inject into the skin 3 (three) times daily before meals.   Yes [provider]  insulin glargine (LANTUS) 100 UNIT/ML injection Inject 45 Units into the skin 2 (two) times daily.   Yes [provider]  cephALEXin (KEFLEX) 500 MG capsule Take 1 capsule (500 mg  total) by mouth 3 (three) times daily. 11/06/19   Virgel Manifold, MD  diclofenac sodium (VOLTAREN) 1 % GEL Apply 2 g topically 4 (four) times daily. 06/18/18   Caccavale, Sophia, PA-C  HYDROcodone-acetaminophen (NORCO/VICODIN) 5-325 MG tablet Take 1 tablet by mouth every 6 (six) hours as needed. 04/29/17   Malvin Johns, MD    Allergies    Codeine  Review of Systems   Review of Systems All systems reviewed and negative, other than as noted in HPI.  Physical Exam Updated Vital Signs BP (!) 167/61   Pulse 80   Temp 100.3 F (37.9 C)   Resp 16   Ht 5' 5.5" (1.664 m)   Wt 95.3 kg   SpO2 99%   BMI 34.41 kg/m   Physical Exam Vitals and nursing note reviewed.  Constitutional:      General: She is not in acute distress.    Appearance: She is well-developed.  HENT:     Head: Normocephalic and atraumatic.  Eyes:     General:        Right eye: No discharge.        Left eye: No discharge.     Conjunctiva/sclera: Conjunctivae normal.  Cardiovascular:     Rate and Rhythm: Normal rate and regular rhythm.     Heart sounds: Normal heart sounds.  No murmur. No friction rub. No gallop.   Pulmonary:     Effort: Pulmonary effort is normal. No respiratory distress.     Breath sounds: Normal breath sounds.  Abdominal:     General: There is no distension.     Palpations: Abdomen is soft.     Tenderness: There is no abdominal tenderness.     Comments: Right CVA tenderness  Musculoskeletal:        General: No tenderness.     Cervical back: Neck supple.  Skin:    General: Skin is warm and dry.  Neurological:     Mental Status: She is alert.  Psychiatric:        Behavior: Behavior normal.        Thought Content: Thought content normal.     ED Results / Procedures / Treatments   Labs (all labs ordered are listed, but only abnormal results are displayed) Labs Reviewed  RESPIRATORY PANEL BY RT PCR (FLU A&B, COVID) - Abnormal; Notable for the following components:      Result Value    SARS Coronavirus 2 by RT PCR POSITIVE (*)    All other components within normal limits  URINALYSIS, ROUTINE W REFLEX MICROSCOPIC - Abnormal; Notable for the following components:   Hgb urine dipstick TRACE (*)    Protein, ur 100 (*)    All other components within normal limits  CBC WITH DIFFERENTIAL/PLATELET - Abnormal; Notable for the following components:   Hemoglobin 11.5 (*)    All other components within normal limits  COMPREHENSIVE METABOLIC PANEL - Abnormal; Notable for the following components:   BUN 27 (*)    Creatinine, Ser 1.97 (*)    Calcium 8.7 (*)    GFR calc non Af Amer 25 (*)    GFR calc Af Amer 30 (*)    All other components within normal limits  URINALYSIS, MICROSCOPIC (REFLEX) - Abnormal; Notable for the following components:   Bacteria, UA FEW (*)    All other components within normal limits  URINE CULTURE  CULTURE, BLOOD (ROUTINE X 2)  CULTURE, BLOOD (ROUTINE X 2)  LIPASE, BLOOD    EKG None  Radiology CT ABDOMEN PELVIS W CONTRAST  Result Date: 11/06/2019 CLINICAL DATA:  Lower back pain.  Urinary frequency for 2 days. EXAM: CT ABDOMEN AND PELVIS WITH CONTRAST TECHNIQUE: Multidetector CT imaging of the abdomen and pelvis was performed using the standard protocol following bolus administration of intravenous contrast. CONTRAST:  56mL OMNIPAQUE IOHEXOL 300 MG/ML  SOLN COMPARISON:  04/23/2017 FINDINGS: Lower chest: Small ground-glass lung base opacities are noted, new since the prior CT. Heart normal in size. No pleural effusion. Hepatobiliary: Normal liver. Small density in the dependent gallbladder consistent with stone. No evidence of acute cholecystitis. No bile duct dilation. Pancreas: Unremarkable. No pancreatic ductal dilatation or surrounding inflammatory changes. Spleen: Normal in size without focal abnormality. Adrenals/Urinary Tract: No adrenal masses. Kidneys normal in size, orientation and position with symmetric enhancement and excretion. No renal masses,  stones or hydronephrosis. Normal ureters. Normal bladder. Stomach/Bowel: Stomach is within normal limits. Appendix appears normal. No evidence of bowel wall thickening, distention, or inflammatory changes. Vascular/Lymphatic: Fairly dense aortic atherosclerotic calcifications. No aneurysm. No enlarged lymph nodes. Reproductive: Subserosal partly calcified fibroid arises from the left uterine fundus. Uterus otherwise unremarkable. No adnexal masses. Other: Small fat containing umbilical hernia.  No ascites. Musculoskeletal: No fracture or acute finding. No osteoblastic or osteolytic lesions. There are predominantly facet degenerative changes at L5-S1. IMPRESSION: 1. No  acute findings within the abdomen or pelvis. No renal or ureteral stones. 2. Probable small gallstone. 3. Dense aortic atherosclerotic calcifications. Electronically Signed   By: Amie Portland M.D.   On: 11/06/2019 19:50    Procedures Procedures (including critical care time)  Medications Ordered in ED Medications  HYDROmorphone (DILAUDID) injection 0.5 mg (0.5 mg Intravenous Given 11/06/19 1818)  ondansetron (ZOFRAN) injection 4 mg (4 mg Intravenous Given 11/06/19 1818)  lactated ringers bolus 1,000 mL (0 mLs Intravenous Stopped 11/06/19 2026)  iohexol (OMNIPAQUE) 300 MG/ML solution 100 mL (60 mLs Intravenous Contrast Given 11/06/19 1925)  cefTRIAXone (ROCEPHIN) 1 g in sodium chloride 0.9 % 100 mL IVPB ( Intravenous Stopped 11/06/19 2127)    ED Course  I have reviewed the triage vital signs and the nursing notes.  Pertinent labs & imaging results that were available during my care of the patient were reviewed by me and considered in my medical decision making (see chart for details).    MDM Rules/Calculators/A&P 69 year old female with right mid back pain.  Has CVA tenderness on exam and dysuria.  Will treat for possible pyelonephritis although her UA is not completely convincing.  Urine culture and blood culture sent.  She is started  on Rocephin.  Prior to discharge her Covid test came back and is positive.  She was informed of this.  Return precautions were discussed.  Symptomatic treatment and quarantine otherwise.  Final Clinical Impression(s) / ED Diagnoses Final diagnoses:  Pyelonephritis  COVID-19 virus infection    Rx / DC Orders ED Discharge Orders         Ordered    cephALEXin (KEFLEX) 500 MG capsule  3 times daily     11/06/19 2209           Raeford Razor, MD 11/06/19 2302

## 2019-11-06 NOTE — ED Notes (Signed)
ED Provider at bedside. 

## 2019-11-06 NOTE — ED Triage Notes (Signed)
Lower back pain. Urinary frequency x 2 days.

## 2019-11-07 ENCOUNTER — Telehealth: Payer: Self-pay | Admitting: Nurse Practitioner

## 2019-11-07 NOTE — Telephone Encounter (Signed)
Called to Discuss with patient about Covid symptoms and the use of bamlanivimab, a monoclonal antibody infusion for those with mild to moderate Covid symptoms and at a high risk of hospitalization.     Pt is qualified for this infusion at the [facility] due to co-morbid conditions and/or a member of an at-risk group.     Unable to reach pt  

## 2019-11-08 LAB — URINE CULTURE

## 2019-11-11 LAB — CULTURE, BLOOD (ROUTINE X 2)
Culture: NO GROWTH
Culture: NO GROWTH
Special Requests: ADEQUATE
Special Requests: ADEQUATE

## 2021-02-01 ENCOUNTER — Emergency Department (HOSPITAL_BASED_OUTPATIENT_CLINIC_OR_DEPARTMENT_OTHER): Payer: Self-pay

## 2021-02-01 ENCOUNTER — Other Ambulatory Visit: Payer: Self-pay

## 2021-02-01 ENCOUNTER — Emergency Department (HOSPITAL_BASED_OUTPATIENT_CLINIC_OR_DEPARTMENT_OTHER)
Admission: EM | Admit: 2021-02-01 | Discharge: 2021-02-01 | Disposition: A | Discharge status: Home / Self Care | Payer: Self-pay | Attending: Emergency Medicine | Admitting: Emergency Medicine

## 2021-02-01 ENCOUNTER — Encounter (HOSPITAL_BASED_OUTPATIENT_CLINIC_OR_DEPARTMENT_OTHER): Payer: Self-pay | Admitting: *Deleted

## 2021-02-01 DIAGNOSIS — E785 Hyperlipidemia, unspecified: Secondary | ICD-10-CM | POA: Insufficient documentation

## 2021-02-01 DIAGNOSIS — R6883 Chills (without fever): Secondary | ICD-10-CM | POA: Insufficient documentation

## 2021-02-01 DIAGNOSIS — Z794 Long term (current) use of insulin: Secondary | ICD-10-CM | POA: Insufficient documentation

## 2021-02-01 DIAGNOSIS — Z79899 Other long term (current) drug therapy: Secondary | ICD-10-CM | POA: Insufficient documentation

## 2021-02-01 DIAGNOSIS — M549 Dorsalgia, unspecified: Secondary | ICD-10-CM | POA: Insufficient documentation

## 2021-02-01 DIAGNOSIS — I10 Essential (primary) hypertension: Secondary | ICD-10-CM | POA: Insufficient documentation

## 2021-02-01 DIAGNOSIS — R109 Unspecified abdominal pain: Secondary | ICD-10-CM | POA: Insufficient documentation

## 2021-02-01 DIAGNOSIS — R110 Nausea: Secondary | ICD-10-CM | POA: Insufficient documentation

## 2021-02-01 DIAGNOSIS — E1169 Type 2 diabetes mellitus with other specified complication: Secondary | ICD-10-CM | POA: Insufficient documentation

## 2021-02-01 LAB — COMPREHENSIVE METABOLIC PANEL
ALT: 21 U/L (ref 0–44)
AST: 23 U/L (ref 15–41)
Albumin: 3.5 g/dL (ref 3.5–5.0)
Alkaline Phosphatase: 56 U/L (ref 38–126)
Anion gap: 10 (ref 5–15)
BUN: 36 mg/dL — ABNORMAL HIGH (ref 8–23)
CO2: 22 mmol/L (ref 22–32)
Calcium: 8.8 mg/dL — ABNORMAL LOW (ref 8.9–10.3)
Chloride: 102 mmol/L (ref 98–111)
Creatinine, Ser: 2.33 mg/dL — ABNORMAL HIGH (ref 0.44–1.00)
GFR, Estimated: 22 mL/min — ABNORMAL LOW (ref >60)
Glucose, Bld: 160 mg/dL — ABNORMAL HIGH (ref 70–99)
Potassium: 3.5 mmol/L (ref 3.5–5.1)
Sodium: 134 mmol/L — ABNORMAL LOW (ref 135–145)
Total Bilirubin: 0.3 mg/dL (ref 0.3–1.2)
Total Protein: 7.3 g/dL (ref 6.5–8.1)

## 2021-02-01 LAB — CBC WITH DIFFERENTIAL/PLATELET
Abs Immature Granulocytes: 0 10*3/uL (ref 0.00–0.07)
Basophils Absolute: 0 10*3/uL (ref 0.0–0.1)
Basophils Relative: 0 %
Eosinophils Absolute: 0.1 10*3/uL (ref 0.0–0.5)
Eosinophils Relative: 2 %
HCT: 34.5 % — ABNORMAL LOW (ref 36.0–46.0)
Hemoglobin: 11.4 g/dL — ABNORMAL LOW (ref 12.0–15.0)
Immature Granulocytes: 0 %
Lymphocytes Relative: 21 %
Lymphs Abs: 0.9 10*3/uL (ref 0.7–4.0)
MCH: 27.9 pg (ref 26.0–34.0)
MCHC: 33 g/dL (ref 30.0–36.0)
MCV: 84.4 fL (ref 80.0–100.0)
Monocytes Absolute: 0.6 10*3/uL (ref 0.1–1.0)
Monocytes Relative: 14 %
Neutro Abs: 2.7 10*3/uL (ref 1.7–7.7)
Neutrophils Relative %: 63 %
Platelets: 191 10*3/uL (ref 150–400)
RBC: 4.09 MIL/uL (ref 3.87–5.11)
RDW: 14.1 % (ref 11.5–15.5)
WBC: 4.3 10*3/uL (ref 4.0–10.5)
nRBC: 0 % (ref 0.0–0.2)

## 2021-02-01 LAB — URINALYSIS, MICROSCOPIC (REFLEX)

## 2021-02-01 LAB — URINALYSIS, ROUTINE W REFLEX MICROSCOPIC
Bilirubin Urine: NEGATIVE
Glucose, UA: NEGATIVE mg/dL
Ketones, ur: NEGATIVE mg/dL
Nitrite: NEGATIVE
Protein, ur: 100 mg/dL — AB
Specific Gravity, Urine: 1.01 (ref 1.005–1.030)
pH: 5.5 (ref 5.0–8.0)

## 2021-02-01 MED ORDER — MORPHINE SULFATE (PF) 4 MG/ML IV SOLN
4.0000 mg | Freq: Once | INTRAVENOUS | Status: AC
Start: 2021-02-01 — End: 2021-02-01
  Administered 2021-02-01: 4 mg via INTRAVENOUS
  Filled 2021-02-01: qty 1

## 2021-02-01 MED ORDER — HYDROCODONE-ACETAMINOPHEN 5-325 MG PO TABS: 1.0000 | ORAL_TABLET | Freq: Four times a day (QID) | ORAL | 0 refills | Status: AC | PRN

## 2021-02-01 MED ORDER — MORPHINE SULFATE (PF) 4 MG/ML IV SOLN
4.0000 mg | Freq: Once | INTRAVENOUS | Status: AC
  Administered 2021-02-01: 4 mg via INTRAVENOUS
  Filled 2021-02-01: qty 1

## 2021-02-01 MED ORDER — LACTATED RINGERS IV BOLUS
1000.0000 mL | Freq: Once | INTRAVENOUS | Status: AC
  Administered 2021-02-01: 1000 mL via INTRAVENOUS

## 2021-02-01 MED ORDER — ONDANSETRON HCL 4 MG/2ML IJ SOLN
4.0000 mg | Freq: Once | INTRAMUSCULAR | Status: AC
  Administered 2021-02-01: 4 mg via INTRAVENOUS
  Filled 2021-02-01: qty 2

## 2021-02-01 NOTE — ED Triage Notes (Signed)
Back pain and chills x 2 days. She has been taking Tylenol. Last dose was an hour ago.

## 2021-02-01 NOTE — Discharge Instructions (Signed)
Make sure you are drinking plenty of fluids.  You can use the pain medication as needed.  You could also try Voltaren gel which is over-the-counter and would be safe to use even with your kidneys.  You just put it where you are having pain.  Urine culture is pending so if it comes back and shows signs of infection someone will call you and give you an antibiotic.  If you start developing high fever, vomiting, severe pain that you cannot tolerate or any type of burning or issues with urination you should return to the emergency room.

## 2021-02-01 NOTE — ED Provider Notes (Signed)
[facility] EMERGENCY DEPARTMENT Provider Note   CSN: [id number] Arrival date & time: 02/01/21  1430     History Chief Complaint  Patient presents with  . Back Pain  . Chills    Kiara Barrett is a 70 y.o. female.  Patient is a 70 year old female with a history of diabetes, hypertension, hyperlipidemia, recurrent UTIs and pyelonephritis who is presenting today with complaint of back pain.  Patient reports 2 days ago she noticed the symptoms starting where she had some mild upper back pain and chills.  Within the last 24 hours the pain has moved down to the right mid to lower back and the right flank area and has become gradually worse.  She has had intermittent chills with this but denies any urinary symptoms.  She has had some mild nausea but denies any vomiting.  Decreased oral intake.  No diarrhea.  She has no abdominal pain, chest pain, shortness of breath, cough or sore throat.  She reports this feels similar to when she had a kidney infection approximately a year and a half ago.  At that time she did have a CT scan that was normal and showed no evidence of kidney stones.  She reports currently the pain is a 9 out of 10 and has not improved with Tylenol which was eventually what caused her to come for evaluation.   Back Pain      Past Medical History:  Diagnosis Date  . Diabetes mellitus without complication ([facility])   . Hyperlipemia   . Hypertension     There are no problems to display for this patient.   Past Surgical History:  Procedure Laterality Date  . CESAREAN SECTION    . TUBAL LIGATION       OB History   No obstetric history on file.     No family history on file.  Social History   Tobacco Use  . Smoking status: Never Smoker  . Smokeless tobacco: Never Used  Vaping Use  . Vaping Use: Never used  Substance Use Topics  . Alcohol use: No  . Drug use: No    Home Medications Prior to Admission medications   Medication Sig Start Date End  Date Taking? Authorizing Provider  amLODipine (NORVASC) 10 MG tablet Take 0.5 tablets (5 mg total) by mouth daily. 11/04/14  Yes Neva Seat, Tiffany, PA-C  atorvastatin (LIPITOR) 20 MG tablet Take 20 mg by mouth daily.   Yes [provider]  carvedilol (COREG) 6.25 MG tablet Take 6.25 mg by mouth 2 (two) times daily with a meal.   Yes [provider]  hydrochlorothiazide (MICROZIDE) 12.5 MG capsule Take 12.5 mg by mouth daily.   Yes [provider]  insulin aspart (NOVOLOG) 100 UNIT/ML injection Inject into the skin 3 (three) times daily before meals.   Yes [provider]  insulin glargine (LANTUS) 100 UNIT/ML injection Inject 45 Units into the skin 2 (two) times daily.   Yes [provider]  cephALEXin (KEFLEX) 500 MG capsule Take 1 capsule (500 mg total) by mouth 3 (three) times daily. 11/06/19   Raeford Razor, MD  diclofenac sodium (VOLTAREN) 1 % GEL Apply 2 g topically 4 (four) times daily. 06/18/18   Caccavale, Sophia, PA-C  HYDROcodone-acetaminophen (NORCO/VICODIN) 5-325 MG tablet Take 1 tablet by mouth every 6 (six) hours as needed. 04/29/17   Rolan Bucco, MD    Allergies    Codeine  Review of Systems   Review of Systems  Musculoskeletal: Positive for  back pain.  All other systems reviewed and are negative.   Physical Exam Updated Vital Signs BP (!) 104/50 (BP Location: Right Arm)   Pulse 65   Temp 97.9 F (36.6 C) (Oral)   Resp 14   Ht 5' 5.5" (1.664 m)   Wt 90.7 kg   SpO2 100%   BMI 32.78 kg/m   Physical Exam Vitals and nursing note reviewed.  Constitutional:      General: She is not in acute distress.    Appearance: Normal appearance. She is well-developed.  HENT:     Head: Normocephalic and atraumatic.     Nose: Nose normal.     Mouth/Throat:     Mouth: Mucous membranes are moist.  Eyes:     Pupils: Pupils are equal, round, and reactive to light.  Cardiovascular:     Rate and Rhythm: Normal rate and regular rhythm.      Heart sounds: Normal heart sounds. No murmur heard. No friction rub.  Pulmonary:     Effort: Pulmonary effort is normal.     Breath sounds: Normal breath sounds. No wheezing or rales.  Abdominal:     General: Bowel sounds are normal. There is no distension.     Palpations: Abdomen is soft.     Tenderness: There is no abdominal tenderness. There is right CVA tenderness. There is no guarding or rebound.  Musculoskeletal:        General: No tenderness. Normal range of motion.     Right lower leg: No edema.     Left lower leg: No edema.     Comments: No edema.  No midline spinal tenderness  Skin:    General: Skin is warm and dry.     Capillary Refill: Capillary refill takes less than 2 seconds.     Findings: No rash.  Neurological:     Mental Status: She is alert and oriented to person, place, and time. Mental status is at baseline.     Cranial Nerves: No cranial nerve deficit.  Psychiatric:        Mood and Affect: Mood normal.        Behavior: Behavior normal.     ED Results / Procedures / Treatments   Labs (all labs ordered are listed, but only abnormal results are displayed) Labs Reviewed  URINALYSIS, ROUTINE W REFLEX MICROSCOPIC - Abnormal; Notable for the following components:      Result Value   Hgb urine dipstick TRACE (*)    Protein, ur 100 (*)    Leukocytes,Ua SMALL (*)    All other components within normal limits  COMPREHENSIVE METABOLIC PANEL - Abnormal; Notable for the following components:   Sodium 134 (*)    Glucose, Bld 160 (*)    BUN 36 (*)    Creatinine, Ser 2.33 (*)    Calcium 8.8 (*)    GFR, Estimated 22 (*)    All other components within normal limits  CBC WITH DIFFERENTIAL/PLATELET - Abnormal; Notable for the following components:   Hemoglobin 11.4 (*)    HCT 34.5 (*)    All other components within normal limits  URINALYSIS, MICROSCOPIC (REFLEX) - Abnormal; Notable for the following components:   Bacteria, UA RARE (*)    All other components within  normal limits  URINE CULTURE  CBC WITH DIFFERENTIAL/PLATELET    EKG None  Radiology CT Renal Stone Study  Result Date: 02/01/2021 CLINICAL DATA:  Back pain and chills for 2 days, initial encounter EXAM: CT ABDOMEN AND  PELVIS WITHOUT CONTRAST TECHNIQUE: Multidetector CT imaging of the abdomen and pelvis was performed following the standard protocol without IV contrast. COMPARISON:  11/06/2019 FINDINGS: Lower chest: Small subpleural left lower lobe densities are noted stable from the prior exam. Hepatobiliary: Liver is within normal limits. Gallbladder is well distended and demonstrates a single dependent gallstone as well as increased density dependently likely representing gallbladder sludge. Pancreas: Unremarkable. No pancreatic ductal dilatation or surrounding inflammatory changes. Spleen: Normal in size without focal abnormality. Adrenals/Urinary Tract: Adrenal glands are within normal limits. Kidneys demonstrate a normal appearance without contrast. No renal calculi are seen. The collecting systems are well visualized as are the ureters. No calculi are seen. Bladder is partially distended. Stomach/Bowel: Appendix is within normal limits. No obstructive or inflammatory changes of the colon are noted. Stomach and small bowel are within normal limits. Vascular/Lymphatic: Aortic atherosclerosis. No enlarged abdominal or pelvic lymph nodes. Reproductive: Uterus shows calcified uterine fibroids similar to that seen on the prior exam. No adnexal mass is noted. Other: No abdominal wall hernia or abnormality. No abdominopelvic ascites. Musculoskeletal: No acute or significant osseous findings. IMPRESSION: Cholelithiasis and likely gallbladder sludge. Uterine fibroid change. No other focal abnormality is noted. Electronically Signed   By: Alcide Clever M.D.   On: 02/01/2021 19:41    Procedures Procedures   Medications Ordered in ED Medications  morphine 4 MG/ML injection 4 mg (has no administration in  time range)  ondansetron (ZOFRAN) injection 4 mg (has no administration in time range)  lactated ringers bolus 1,000 mL (has no administration in time range)    ED Course  I have reviewed the triage vital signs and the nursing notes.  Pertinent labs & imaging results that were available during my care of the patient were reviewed by me and considered in my medical decision making (see chart for details).    MDM Rules/Calculators/A&P                          Patient is a 70 year old female presenting today with complaints of right flank pain over the last 2 to 3 days.  Symptoms have worsened and she has had subjective chills and poor appetite.  Patient is well-appearing on exam with normal vital signs.  She denies any chest or respiratory symptoms and low suspicion for acute cardiac pathology, pneumonia, PE or dissection.  Patient has mostly right flank pain and denies significant abdominal pain with a benign abdominal exam.  Concern given patient's history of recurrent UTIs and chills today for possible pyelonephritis versus kidney stone.  Patient has no midline spinal tenderness or radicular type symptoms.  Low concern at this time for discitis, epidural abscess or osteomyelitis of the spine.  Patient did have a CT approximately a year and a half ago that did not have any stones present.  She is currently having pain of 9 out of 10 that has not been resolved with Tylenol and was given a dose of pain medication.  Will hydrate due to poor oral intake in the last few days.  Urine, blood work pending.  8:03 PM UA with trace hemoglobin, leukocytes with 6-10 white cells and rare bacteria.  CBC without acute findings, CMP with creatinine of 2.33 which is elevated from baseline of 1.9 which last was evaluated on 06/2020.  Renal stone study shows cholelithiasis with likely gallbladder sludge, uterine fibroid but no other acute abnormalities.  She has no hydronephrosis at this time.  Patient's pain was  initially controlled with morphine but after she went to CT the pain started returning.  Pain could be musculoskeletal as she does report it is moving throughout her back but there is no significant finding for UTI at this time.  Urine culture was done.  However patient has been afebrile and has normal white count here.  Also potential for small renal stone that is nonobstructing as there is blood in the urine.  Discussed findings with patient.  Will treat with pain medication and have her follow closely with her nephrologist or PCP for recheck on her creatinine.  Urine culture also following to ensure no evidence of UTI.  MDM Number of Diagnoses or Management Options   Amount and/or Complexity of Data Reviewed Clinical lab tests: ordered and reviewed Tests in the radiology section of CPT: ordered and reviewed Independent visualization of images, tracings, or specimens: yes    Final Clinical Impression(s) / ED Diagnoses Final diagnoses:  Acute right flank pain    Rx / DC Orders ED Discharge Orders         Ordered    HYDROcodone-acetaminophen (NORCO/VICODIN) 5-325 MG tablet  Every 6 hours PRN        02/01/21 2008           Gwyneth Sprout, MD 02/01/21 2010

## 2021-02-03 LAB — URINE CULTURE: Culture: NO GROWTH

## 2021-10-31 IMAGING — CT CT ABD-PELV W/ CM
2 of 5 series · 16 of 46 positions shown, 18 images · IV contrast (omnipaque)
Comparison: 04/23/2017

CLINICAL DATA: Lower back pain.  Urinary frequency for 2 days.

EXAM:
CT ABDOMEN AND PELVIS WITH CONTRAST
TECHNIQUE: Multidetector CT imaging of the abdomen and pelvis was performed
using the standard protocol following bolus administration of
intravenous contrast.
CONTRAST:  60mL OMNIPAQUE IOHEXOL 300 MG/ML  SOLN

[Series 2: axial st · axial · 0.70mm/px · z∈[-232,+128]mm · 13 of 81 slices shown, 15 images]
[im 5/81  soft-tissue]
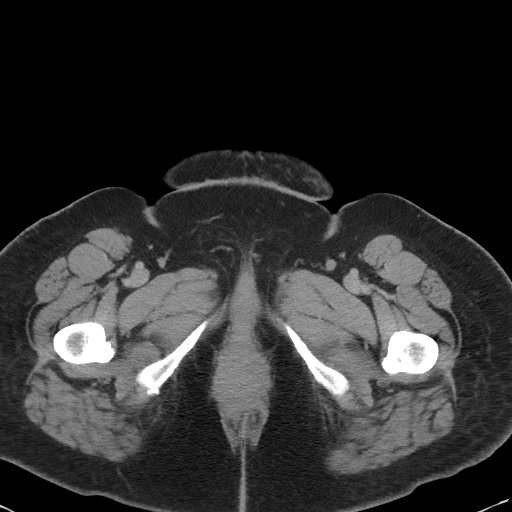
[im 5/81  bone]
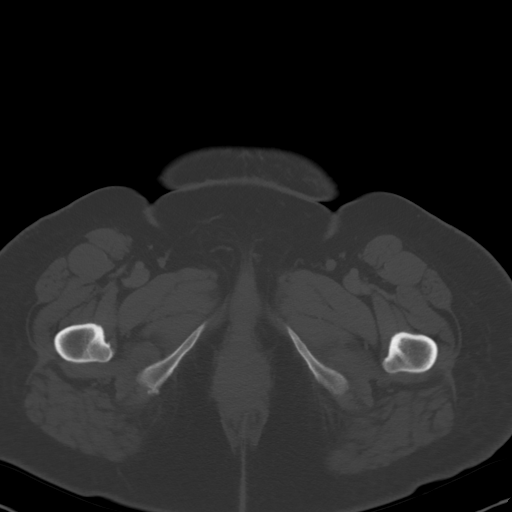
[im 13/81  soft-tissue]
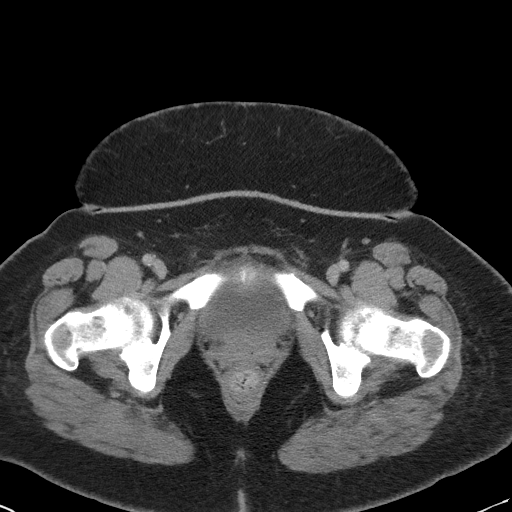
[im 17/81  soft-tissue]
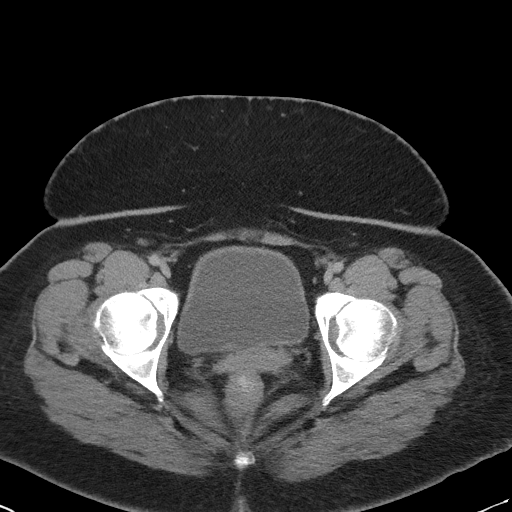
[im 25/81  soft-tissue]
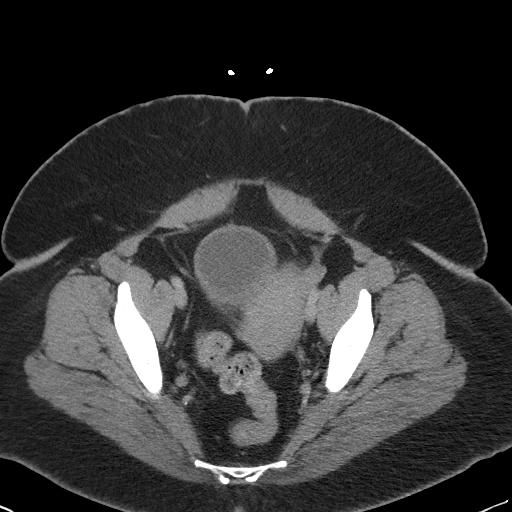
[im 29/81  soft-tissue]
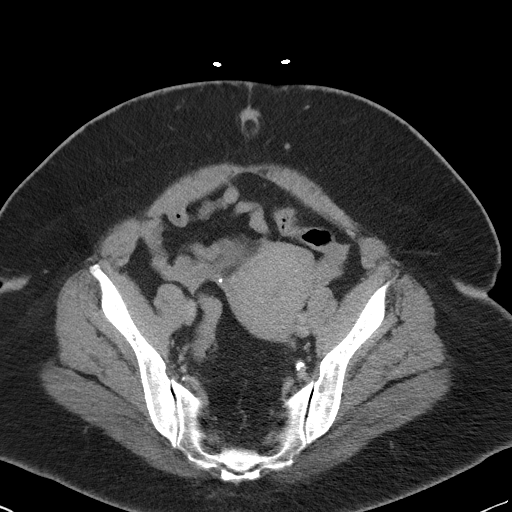
[im 37/81  soft-tissue]
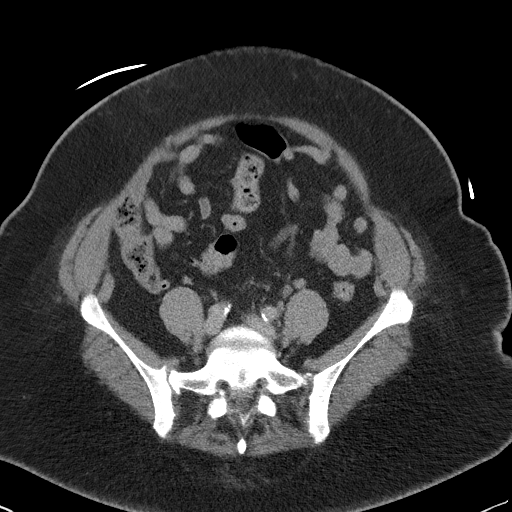
[im 41/81  soft-tissue]
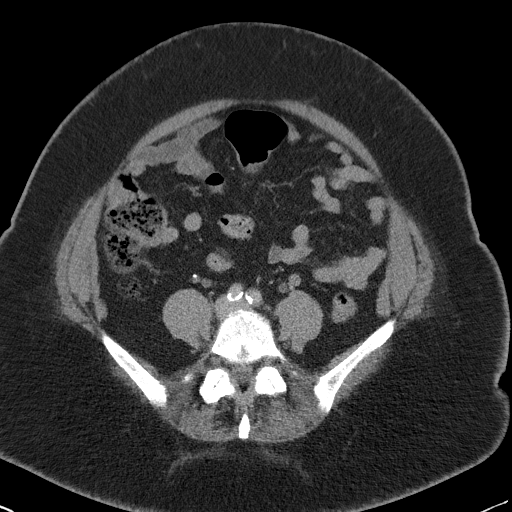
[im 45/81  soft-tissue]
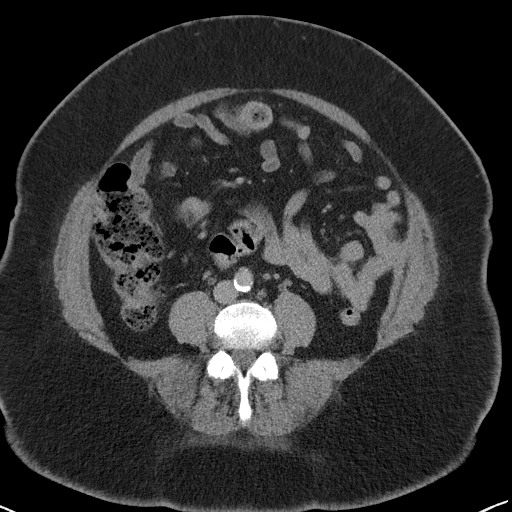
[im 53/81  soft-tissue]
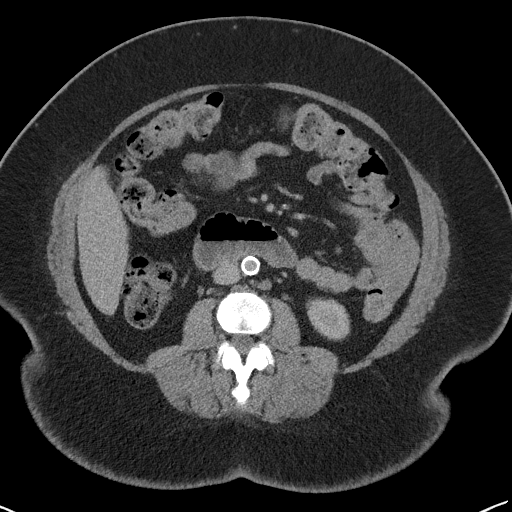
[im 53/81  bone]
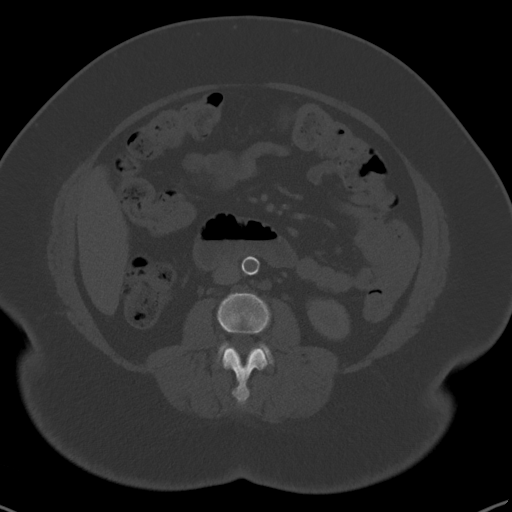
[im 57/81  soft-tissue]
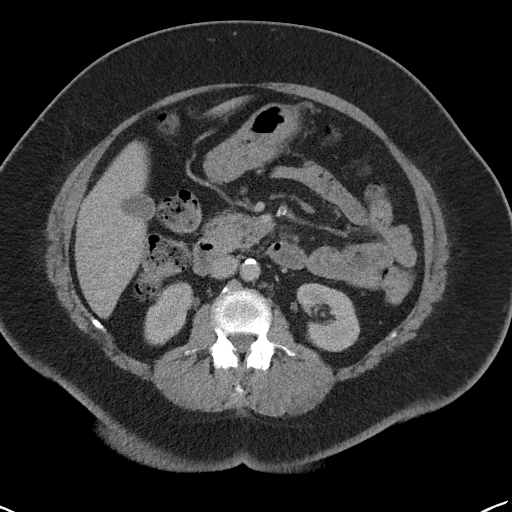
[im 65/81  soft-tissue]
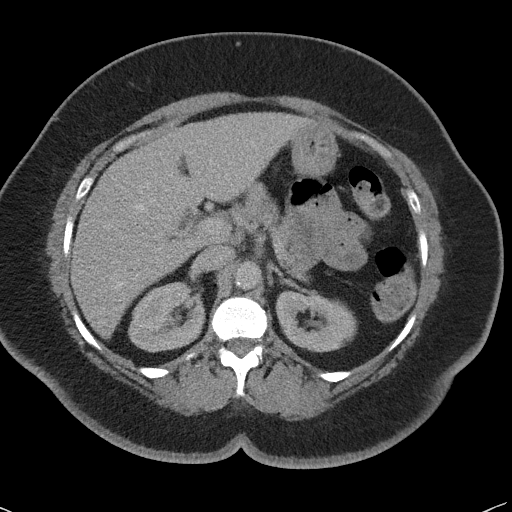
[im 69/81  soft-tissue]
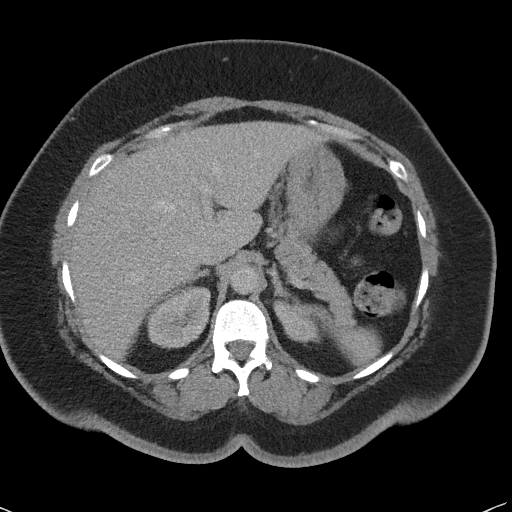
[im 77/81  soft-tissue]
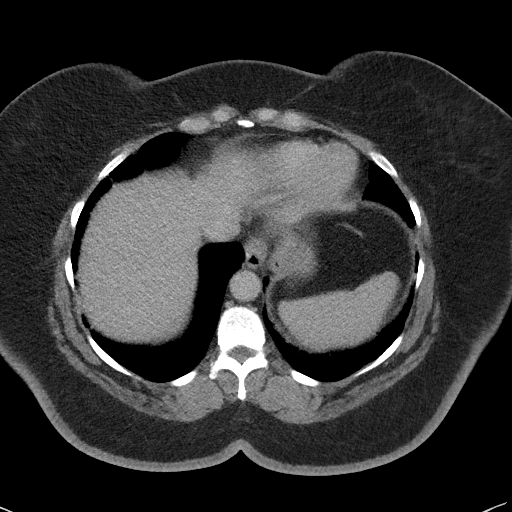

[Series 5: coronal st · coronal · 0.59mm/px · 3 of 112 slices shown]
[im 38/112  soft-tissue]
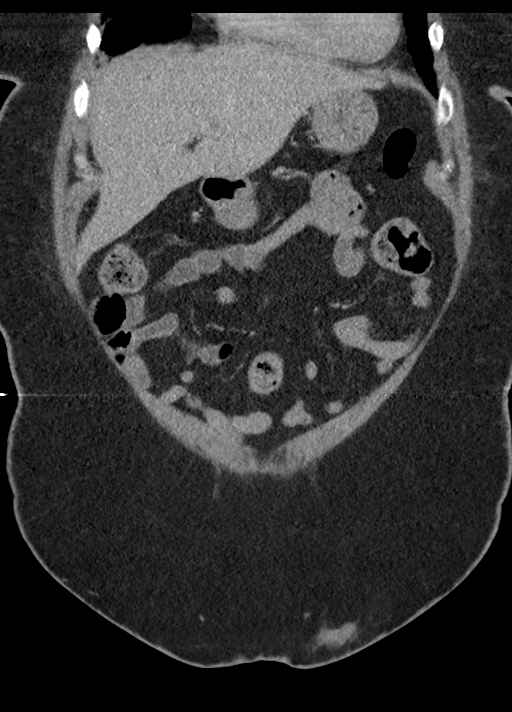
[im 50/112  soft-tissue]
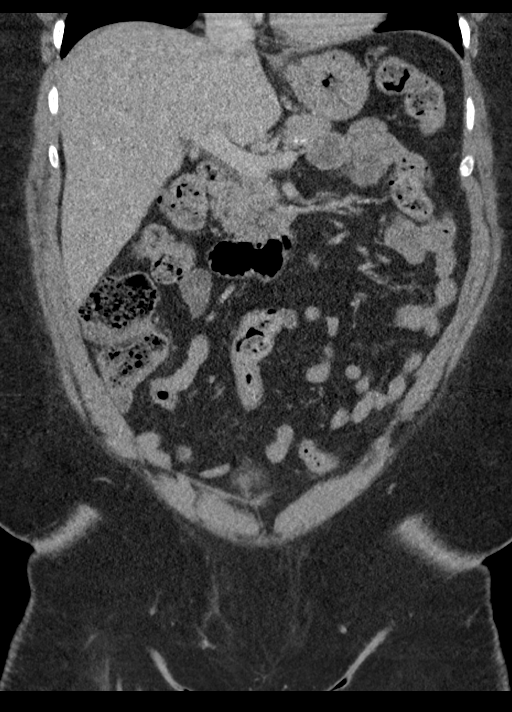
[im 62/112  soft-tissue]
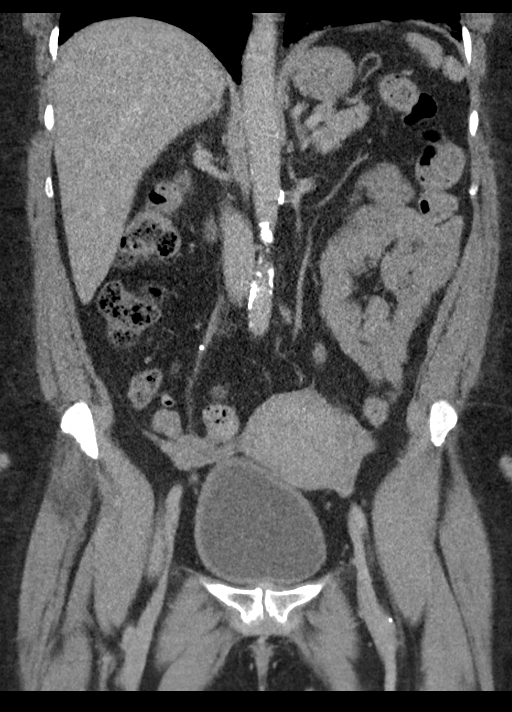

[16 of 46 positions shown; findings below may reference images not displayed]

FINDINGS: Lower chest: Small ground-glass lung base opacities are noted, new
since the prior CT. Heart normal in size. No pleural effusion.

Hepatobiliary: Normal liver. Small density in the dependent
gallbladder consistent with stone. No evidence of acute
cholecystitis. No bile duct dilation.

Pancreas: Unremarkable. No pancreatic ductal dilatation or
surrounding inflammatory changes.

Spleen: Normal in size without focal abnormality.

Adrenals/Urinary Tract: No adrenal masses.

Kidneys normal in size, orientation and position with symmetric
enhancement and excretion. No renal masses, stones or
hydronephrosis. Normal ureters. Normal bladder.

Stomach/Bowel: Stomach is within normal limits. Appendix appears
normal. No evidence of bowel wall thickening, distention, or
inflammatory changes.

Vascular/Lymphatic: Fairly dense aortic atherosclerotic
calcifications. No aneurysm. No enlarged lymph nodes.

Reproductive: Subserosal partly calcified fibroid arises from the
left uterine fundus. Uterus otherwise unremarkable. No adnexal
masses.

Other: Small fat containing umbilical hernia.  No ascites.

Musculoskeletal: No fracture or acute finding. No osteoblastic or
osteolytic lesions. There are predominantly facet degenerative
changes at L5-S1.
IMPRESSION: 1. No acute findings within the abdomen or pelvis. No renal or
ureteral stones.
2. Probable small gallstone.
3. Dense aortic atherosclerotic calcifications.

## 2023-01-27 IMAGING — CT CT RENAL STONE PROTOCOL
2 of 4 series · 17 of 46 positions shown, 19 images · non-contrast
Comparison: 11/06/2019

CLINICAL DATA: Back pain and chills for 2 days, initial encounter

EXAM:
CT ABDOMEN AND PELVIS WITHOUT CONTRAST
TECHNIQUE: Multidetector CT imaging of the abdomen and pelvis was performed
following the standard protocol without IV contrast.

[Series 2: axial st · axial · 0.90mm/px · z∈[+634,+1069]mm · 14 of 95 slices shown, 16 images]
[im 4/95  soft-tissue]
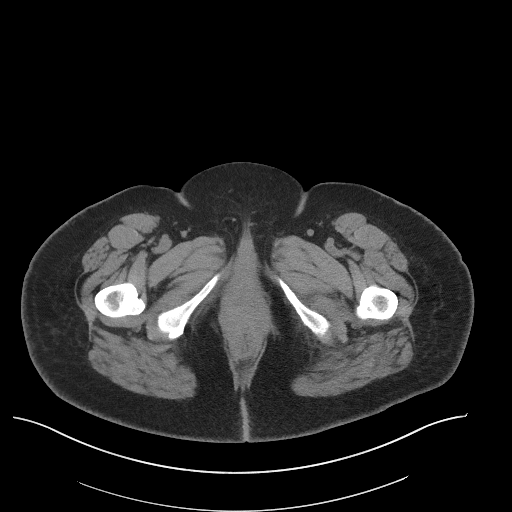
[im 4/95  bone]
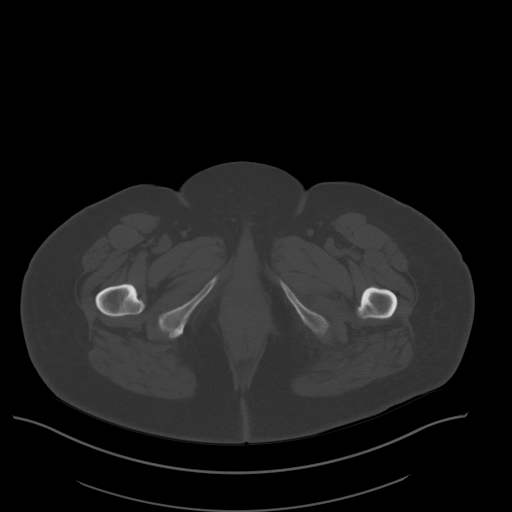
[im 12/95  soft-tissue]
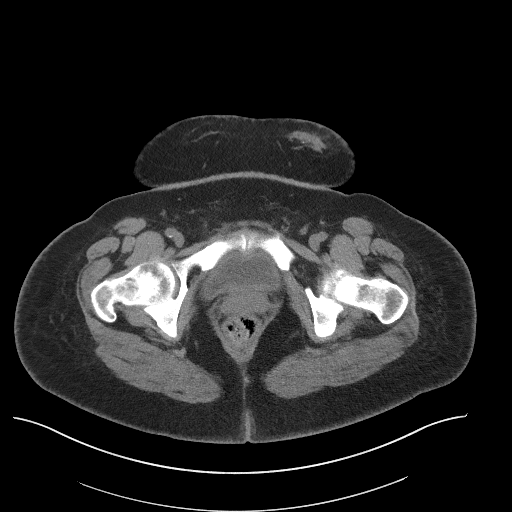
[im 19/95  soft-tissue]
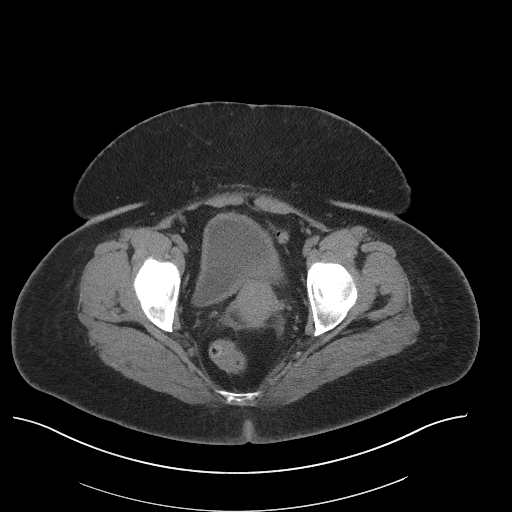
[im 27/95  soft-tissue]
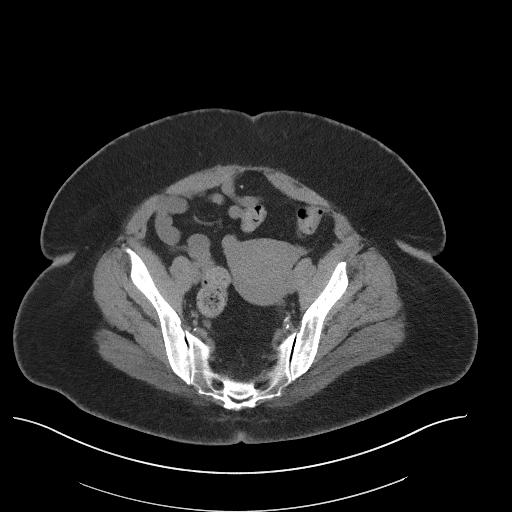
[im 31/95  soft-tissue]
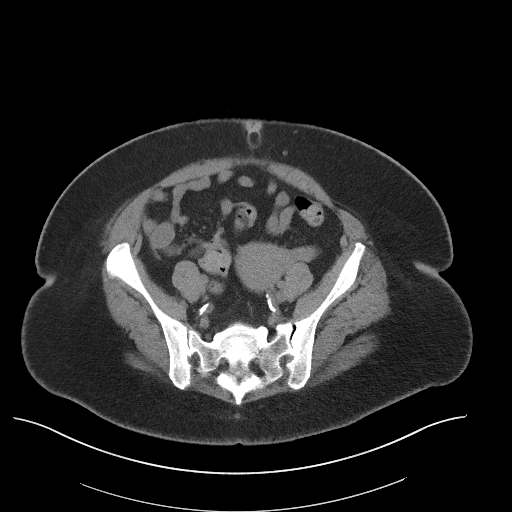
[im 38/95  soft-tissue]
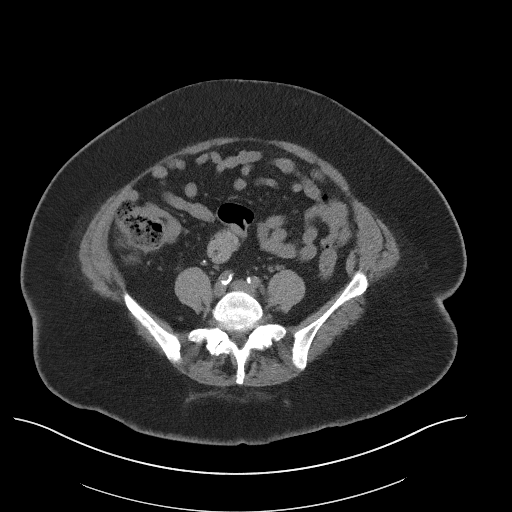
[im 46/95  soft-tissue]
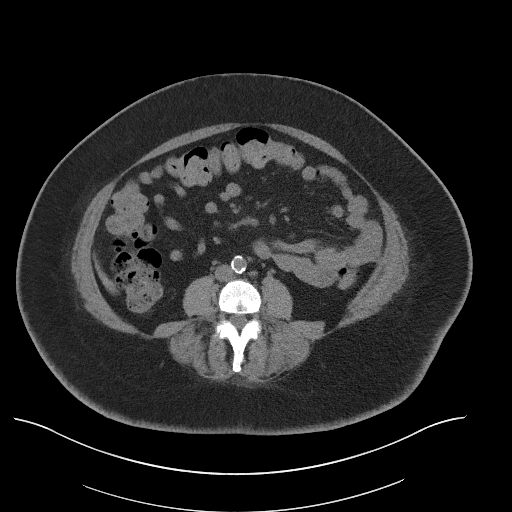
[im 49/95  soft-tissue]
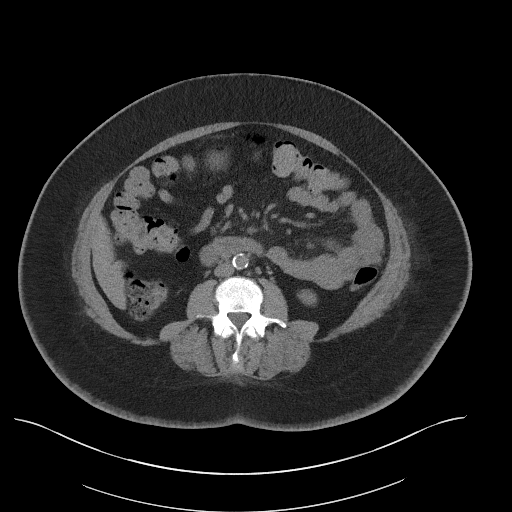
[im 57/95  soft-tissue]
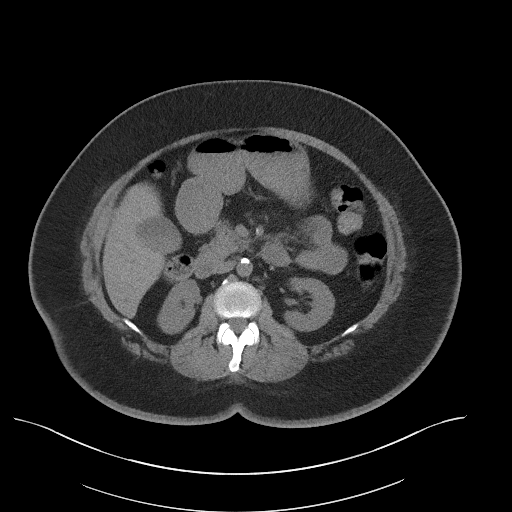
[im 57/95  bone]
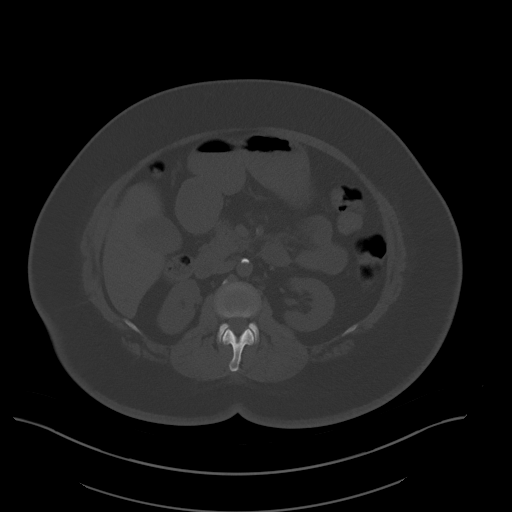
[im 64/95  soft-tissue]
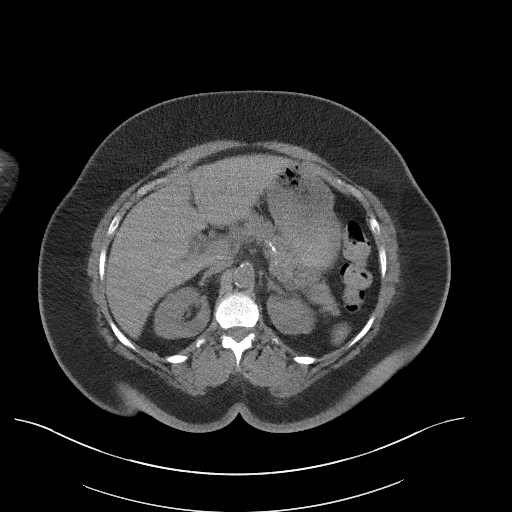
[im 72/95  soft-tissue]
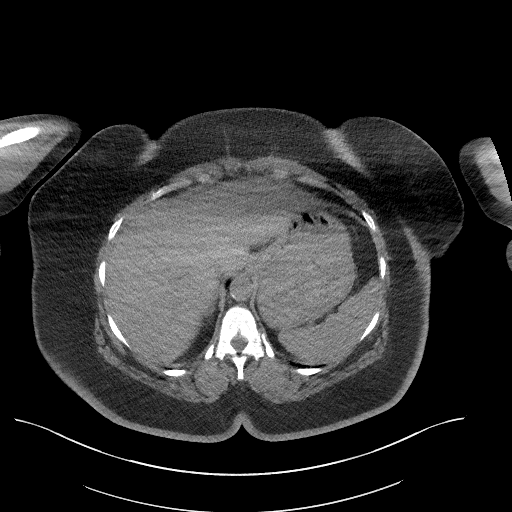
[im 76/95  soft-tissue]
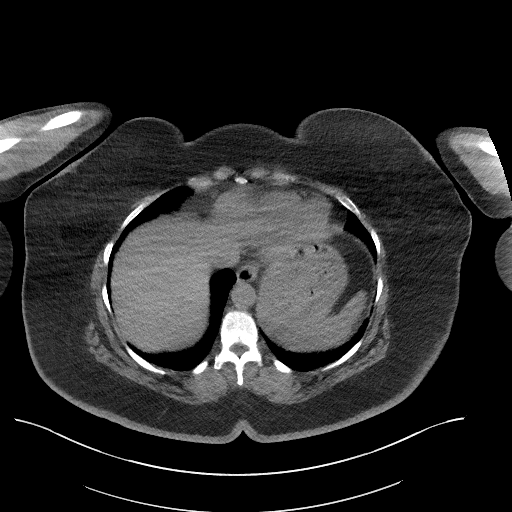
[im 83/95  soft-tissue]
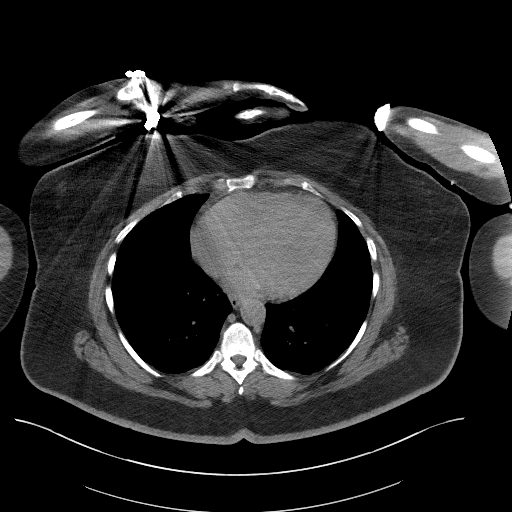
[im 91/95  soft-tissue]
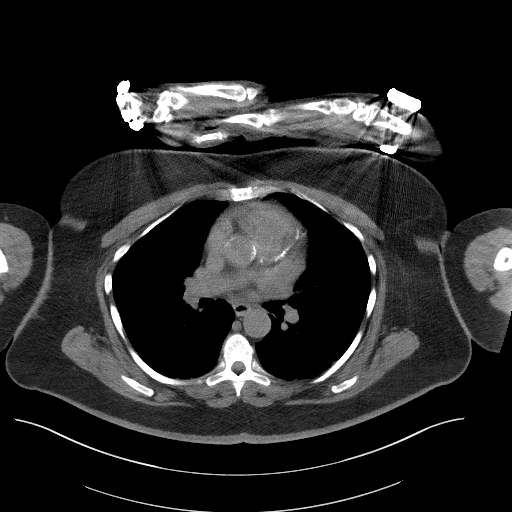

[Series 5: coronal st · coronal · 0.88mm/px · 3 of 117 slices shown]
[im 39/117  soft-tissue]
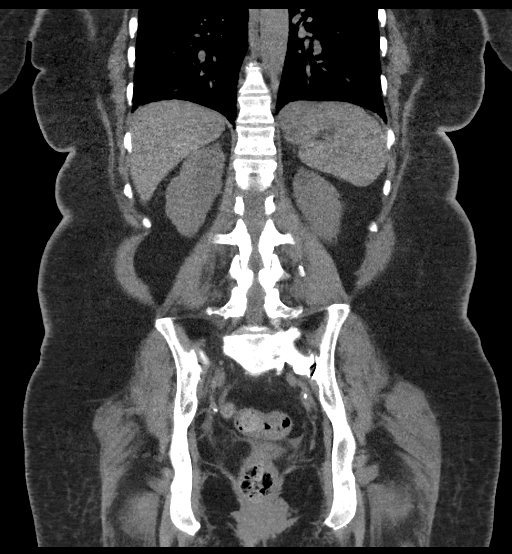
[im 52/117  soft-tissue]
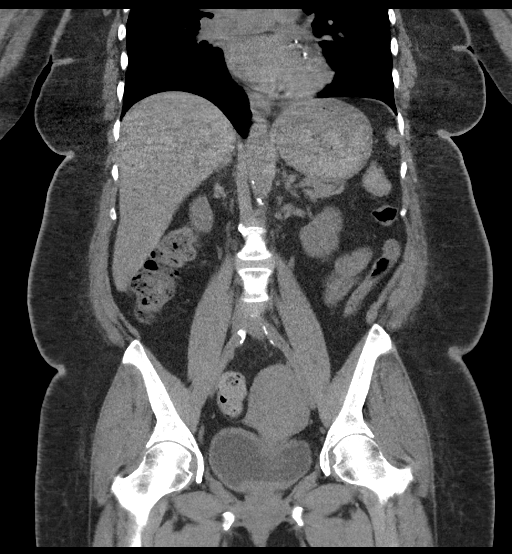
[im 65/117  soft-tissue]
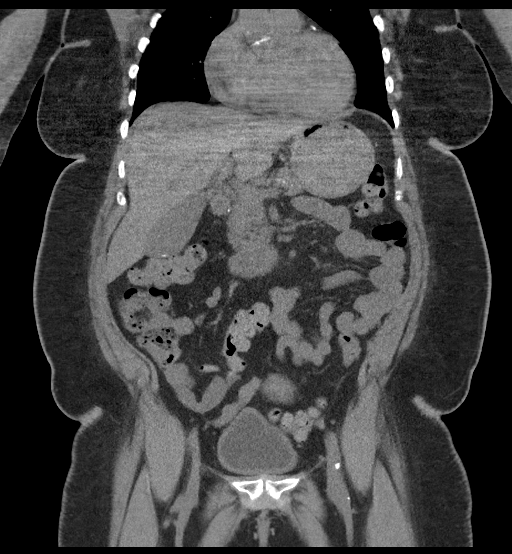

[17 of 46 positions shown; findings below may reference images not displayed]

FINDINGS: Lower chest: Small subpleural left lower lobe densities are noted
stable from the prior exam.

Hepatobiliary: Liver is within normal limits. Gallbladder is well
distended and demonstrates a single dependent gallstone as well as
increased density dependently likely representing gallbladder
sludge.

Pancreas: Unremarkable. No pancreatic ductal dilatation or
surrounding inflammatory changes.

Spleen: Normal in size without focal abnormality.

Adrenals/Urinary Tract: Adrenal glands are within normal limits.
Kidneys demonstrate a normal appearance without contrast. No renal
calculi are seen. The collecting systems are well visualized as are
the ureters. No calculi are seen. Bladder is partially distended.

Stomach/Bowel: Appendix is within normal limits. No obstructive or
inflammatory changes of the colon are noted. Stomach and small bowel
are within normal limits.

Vascular/Lymphatic: Aortic atherosclerosis. No enlarged abdominal or
pelvic lymph nodes.

Reproductive: Uterus shows calcified uterine fibroids similar to
that seen on the prior exam. No adnexal mass is noted.

Other: No abdominal wall hernia or abnormality. No abdominopelvic
ascites.

Musculoskeletal: No acute or significant osseous findings.
IMPRESSION: Cholelithiasis and likely gallbladder sludge.

Uterine fibroid change.

No other focal abnormality is noted.

## 2023-11-28 ENCOUNTER — Emergency Department (HOSPITAL_BASED_OUTPATIENT_CLINIC_OR_DEPARTMENT_OTHER)
Admission: EM | Admit: 2023-11-28 | Discharge: 2023-11-28 | Disposition: A | Discharge status: Home / Self Care | Payer: Medicare Other | Attending: Emergency Medicine | Admitting: Emergency Medicine

## 2023-11-28 ENCOUNTER — Other Ambulatory Visit: Payer: Self-pay

## 2023-11-28 ENCOUNTER — Encounter (HOSPITAL_BASED_OUTPATIENT_CLINIC_OR_DEPARTMENT_OTHER): Payer: Self-pay | Admitting: Radiology

## 2023-11-28 DIAGNOSIS — R509 Fever, unspecified: Secondary | ICD-10-CM | POA: Diagnosis not present

## 2023-11-28 DIAGNOSIS — E119 Type 2 diabetes mellitus without complications: Secondary | ICD-10-CM | POA: Insufficient documentation

## 2023-11-28 DIAGNOSIS — J210 Acute bronchiolitis due to respiratory syncytial virus: Secondary | ICD-10-CM | POA: Diagnosis not present

## 2023-11-28 DIAGNOSIS — R059 Cough, unspecified: Secondary | ICD-10-CM | POA: Diagnosis present

## 2023-11-28 DIAGNOSIS — Z79899 Other long term (current) drug therapy: Secondary | ICD-10-CM | POA: Insufficient documentation

## 2023-11-28 DIAGNOSIS — Z20822 Contact with and (suspected) exposure to covid-19: Secondary | ICD-10-CM | POA: Insufficient documentation

## 2023-11-28 DIAGNOSIS — Z794 Long term (current) use of insulin: Secondary | ICD-10-CM | POA: Diagnosis not present

## 2023-11-28 LAB — RESP PANEL BY RT-PCR (RSV, FLU A&B, COVID)  RVPGX2
Influenza A by PCR: NEGATIVE
Influenza B by PCR: NEGATIVE
Resp Syncytial Virus by PCR: POSITIVE — AB
SARS Coronavirus 2 by RT PCR: NEGATIVE

## 2023-11-28 MED ORDER — ACETAMINOPHEN 500 MG PO TABS
1000.0000 mg | ORAL_TABLET | Freq: Once | ORAL | Status: AC
  Administered 2023-11-28: 1000 mg via ORAL
  Filled 2023-11-28: qty 2

## 2023-11-28 NOTE — ED Notes (Signed)
Pt states she is having a headache at the front of her head with light sensitivity.

## 2023-11-28 NOTE — ED Provider Notes (Signed)
[place] [facility] Provider Note   CSN: [id number] Arrival date & time: 11/28/23  2105     History  Chief Complaint  Patient presents with   URI    Kiara Barrett is a 73 y.o. female.  Patient here with viral symptoms.  Has been in the ED the last 2 days with her husband who is waiting for hospital bed.  She has been having bodyaches and cough.  Low-grade fever.  She was leaving the emergency department when she started to feel feverish and chills headache.  Decided to check in to get evaluated.  She has a history of diabetes.  She denies any vision loss speech changes chest pain shortness of breath.  Denies any sputum production.  The history is provided by the patient.       Home Medications Prior to Admission medications   Medication Sig Start Date End Date Taking? Authorizing Provider  amLODipine (NORVASC) 10 MG tablet Take 0.5 tablets (5 mg total) by mouth daily. 11/04/14   Marlon Pel, PA-C  atorvastatin (LIPITOR) 20 MG tablet Take 20 mg by mouth daily.    [provider]  carvedilol (COREG) 6.25 MG tablet Take 6.25 mg by mouth 2 (two) times daily with a meal.    [provider]  cephALEXin (KEFLEX) 500 MG capsule Take 1 capsule (500 mg total) by mouth 3 (three) times daily. 11/06/19   Raeford Razor, MD  diclofenac sodium (VOLTAREN) 1 % GEL Apply 2 g topically 4 (four) times daily. 06/18/18   Caccavale, Sophia, PA-C  hydrochlorothiazide (MICROZIDE) 12.5 MG capsule Take 12.5 mg by mouth daily.    [provider]  HYDROcodone-acetaminophen (NORCO/VICODIN) 5-325 MG tablet Take 1 tablet by mouth every 6 (six) hours as needed. 02/01/21   Gwyneth Sprout, MD  insulin aspart (NOVOLOG) 100 UNIT/ML injection Inject into the skin 3 (three) times daily before meals.    [provider]  insulin glargine (LANTUS) 100 UNIT/ML injection Inject 45 Units into the skin 2 (two) times daily.    [provider]       Allergies    Codeine    Review of Systems   Review of Systems  Physical Exam Updated Vital Signs BP (!) 161/53 (BP Location: Right Arm)   Pulse 76   Temp 99.4 F (37.4 C)   Resp 20   Ht 5\' 5"  (1.651 m)   Wt 81.6 kg   SpO2 99%   BMI 29.95 kg/m  Physical Exam Vitals and nursing note reviewed.  Constitutional:      General: She is not in acute distress.    Appearance: She is well-developed. She is not ill-appearing.  HENT:     Head: Normocephalic and atraumatic.     Nose: Nose normal.     Mouth/Throat:     Mouth: Mucous membranes are moist.  Eyes:     Extraocular Movements: Extraocular movements intact.     Conjunctiva/sclera: Conjunctivae normal.     Pupils: Pupils are equal, round, and reactive to light.  Cardiovascular:     Rate and Rhythm: Normal rate and regular rhythm.     Pulses: Normal pulses.     Heart sounds: Normal heart sounds. No murmur heard. Pulmonary:     Effort: Pulmonary effort is normal. No respiratory distress.     Breath sounds: Normal breath sounds.  Abdominal:     Palpations: Abdomen is soft.     Tenderness: There is no abdominal tenderness.  Musculoskeletal:  General: No swelling.     Cervical back: Normal range of motion and neck supple.  Skin:    General: Skin is warm and dry.     Capillary Refill: Capillary refill takes less than 2 seconds.  Neurological:     General: No focal deficit present.     Mental Status: She is alert and oriented to person, place, and time.     Cranial Nerves: No cranial nerve deficit.     Sensory: No sensory deficit.     Motor: No weakness.     Coordination: Coordination normal.     Comments: 5+ out of 5 strength all, normal sensation, no drift, normal finger-to-nose finger, normal speech  Psychiatric:        Mood and Affect: Mood normal.     ED Results / Procedures / Treatments   Labs (all labs ordered are listed, but only abnormal results are displayed) Labs Reviewed  RESP PANEL BY  RT-PCR (RSV, FLU A&B, COVID)  RVPGX2 - Abnormal; Notable for the following components:      Result Value   Resp Syncytial Virus by PCR POSITIVE (*)    All other components within normal limits    EKG None  Radiology No results found.  Procedures Procedures    Medications Ordered in ED Medications  acetaminophen (TYLENOL) tablet 1,000 mg (1,000 mg Oral Given 11/28/23 2150)    ED Course/ Medical Decision Making/ A&P                                 Medical Decision Making Risk OTC drugs.   Kiara Barrett is here with flulike symptoms.  History of diabetes.  Low-grade fever but otherwise normal vitals.  Well-appearing.  Normal neurological exam.  Have no concern for stroke.  Seems viral in nature.  Sick contacts.  Will test for COVID flu RSV.  She is got clear breath sounds.  No wheezing.  Symptoms only for 2 days.  I do not have any concern for pneumonia.  Will give Tylenol  RSV test is positive.  Overall continue supportive care at home with Tylenol ibuprofen and rest and hydration.  Discharged in good condition.  Understands return precautions.  This chart was dictated using voice recognition software.  Despite best efforts to proofread,  errors can occur which can change the documentation meaning.         Final Clinical Impression(s) / ED Diagnoses Final diagnoses:  RSV (acute bronchiolitis due to respiratory syncytial virus)    Rx / DC Orders ED Discharge Orders     None         Virgina Norfolk, DO 11/28/23 2230

## 2023-11-28 NOTE — ED Triage Notes (Signed)
Pt states she has had a headache since around 6pm today. Pt states the headache came on suddenly when she was leaving from here. Pt states that when she turns her head from side to side her neck on both sides "throbs". Pt husband is still a patient in our ed. Pt denies use of blood thinner. PT denies blurred vision or dizziness. Pt states she has not felt great since Sunday and has had a cough.

## 2023-11-28 NOTE — Discharge Instructions (Signed)
Continue Tylenol and ibuprofen for body aches and fever.  You tested positive for RSV which is a common virus.  Please return if symptoms worsen.

## 2023-11-28 NOTE — ED Notes (Signed)
EDP at bedside

## 2023-12-07 ENCOUNTER — Encounter (HOSPITAL_BASED_OUTPATIENT_CLINIC_OR_DEPARTMENT_OTHER): Payer: Self-pay | Admitting: Emergency Medicine

## 2023-12-07 ENCOUNTER — Emergency Department (HOSPITAL_BASED_OUTPATIENT_CLINIC_OR_DEPARTMENT_OTHER)
Admission: EM | Admit: 2023-12-07 | Discharge: 2023-12-07 | Disposition: A | Discharge status: Home / Self Care | Payer: Medicare Other | Attending: Emergency Medicine | Admitting: Emergency Medicine

## 2023-12-07 ENCOUNTER — Other Ambulatory Visit: Payer: Self-pay

## 2023-12-07 ENCOUNTER — Emergency Department (HOSPITAL_BASED_OUTPATIENT_CLINIC_OR_DEPARTMENT_OTHER): Payer: Medicare Other

## 2023-12-07 DIAGNOSIS — Z79899 Other long term (current) drug therapy: Secondary | ICD-10-CM | POA: Insufficient documentation

## 2023-12-07 DIAGNOSIS — M5136 Other intervertebral disc degeneration, lumbar region with discogenic back pain only: Secondary | ICD-10-CM | POA: Diagnosis not present

## 2023-12-07 DIAGNOSIS — R030 Elevated blood-pressure reading, without diagnosis of hypertension: Secondary | ICD-10-CM

## 2023-12-07 DIAGNOSIS — M545 Low back pain, unspecified: Secondary | ICD-10-CM | POA: Diagnosis present

## 2023-12-07 DIAGNOSIS — I10 Essential (primary) hypertension: Secondary | ICD-10-CM | POA: Diagnosis not present

## 2023-12-07 DIAGNOSIS — M5441 Lumbago with sciatica, right side: Secondary | ICD-10-CM

## 2023-12-07 MED ORDER — ACETAMINOPHEN 500 MG PO TABS
1000.0000 mg | ORAL_TABLET | Freq: Once | ORAL | Status: AC
  Administered 2023-12-07: 1000 mg via ORAL
  Filled 2023-12-07: qty 2

## 2023-12-07 MED ORDER — PREDNISONE 50 MG PO TABS
60.0000 mg | ORAL_TABLET | Freq: Once | ORAL | Status: AC
  Administered 2023-12-07: 60 mg via ORAL
  Filled 2023-12-07: qty 1

## 2023-12-07 MED ORDER — PREDNISONE 20 MG PO TABS: ORAL_TABLET | ORAL | 0 refills | Status: DC

## 2023-12-07 MED ORDER — TRAMADOL HCL 50 MG PO TABS: 50.0000 mg | ORAL_TABLET | Freq: Three times a day (TID) | ORAL | 0 refills | Status: AC | PRN

## 2023-12-07 NOTE — ED Triage Notes (Signed)
 Pt POV slow gait- pt declined to sit during triage d/t worsening pain when sitting.   C/o R hip pain radiating down leg x 4 days, progressively worsening.  Denies injury.   Took tylenol appx 0800 today, little relief.

## 2023-12-07 NOTE — Discharge Instructions (Addendum)
 It was our pleasure to provide your ER care today - we hope that you feel better.  Avoid bending at waist or heavy lifting > 20 lbs for the next week.  Try gentle massage and/or heat  therapy to sore area. Take prednisone as prescribed,  Take acetaminophen as need for pain. You may also take ultram as need for pain - no driving when taking.   Follow up with closely primary care doctor in the next 1-2 weeks if symptoms fail to improve/resolve. Also follow up with your doctor in the next few weeks regarding your blood pressure that is high today.  Return to ER if worse, new symptoms, fevers, severe/intractable pain, numbness/weakness, problems with normal bowel/bladder function, or other concern.

## 2023-12-07 NOTE — ED Provider Notes (Signed)
 [place] [facility] [place] Provider [place]   CSN: [id number] Arrival date & time: 12/07/23  1216     History  Chief Complaint  Patient presents with  . Hip Pain    Kiara Barrett is a 73 y.o. female.  Pt with pain to right low back radiating down right posterior buttock/thigh area. Symptoms present in past week. Denies specific injury or strain. No trauma or fall. No hx ddd. Pain worse w certain movements, positional changes, bending at waist. No saddle area or leg numbness. No weakness. No change in bowel/bladder function. No abd pain or pelvic pain. No dysuria, hematuria or gu c/o. No leg swelling. No skin changes, redness or rash. No fever or chills.   The history is provided by the patient and medical records.  Hip Pain Pertinent negatives include no chest pain, no abdominal pain and no shortness of breath.       Home Medications Prior to Admission medications   Medication Sig Start Date End Date Taking? Authorizing Provider  amLODipine (NORVASC) 10 MG tablet Take 0.5 tablets (5 mg total) by mouth daily. 11/04/14   Marlon Pel, PA-C  atorvastatin (LIPITOR) 20 MG tablet Take 20 mg by mouth daily.    [provider]  carvedilol (COREG) 6.25 MG tablet Take 6.25 mg by mouth 2 (two) times daily with a meal.    [provider]  cephALEXin (KEFLEX) 500 MG capsule Take 1 capsule (500 mg total) by mouth 3 (three) times daily. 11/06/19   Raeford Razor, MD  diclofenac sodium (VOLTAREN) 1 % GEL Apply 2 g topically 4 (four) times daily. 06/18/18   Caccavale, Sophia, PA-C  hydrochlorothiazide (MICROZIDE) 12.5 MG capsule Take 12.5 mg by mouth daily.    [provider]  HYDROcodone-acetaminophen (NORCO/VICODIN) 5-325 MG tablet Take 1 tablet by mouth every 6 (six) hours as needed. 02/01/21   Gwyneth Sprout, MD  insulin aspart (NOVOLOG) 100 UNIT/ML injection Inject into the skin 3 (three) times daily before meals.    [provider]  insulin glargine (LANTUS) 100 UNIT/ML injection Inject 45 Units into the skin 2 (two) times daily.    [provider]      Allergies    Codeine    Review of Systems   Review of Systems  Constitutional:  Negative for chills and fever.  Respiratory:  Negative for shortness of breath.   Cardiovascular:  Negative for chest pain.  Gastrointestinal:  Negative for abdominal pain, nausea and vomiting.  Genitourinary:  Negative for difficulty urinating and dysuria.  Musculoskeletal:  Positive for back pain.  Skin:  Negative for rash.  Neurological:  Negative for weakness and numbness.    Physical Exam Updated Vital Signs BP (!) 159/58 (BP Location: Left Arm)   Pulse 72   Temp 97.8 F (36.6 C)   Resp 17   Ht 1.651 m (5\' 5" )   Wt 83.9 kg   SpO2 100%   BMI 30.79 kg/m  Physical Exam Vitals and nursing note reviewed.  Constitutional:      Appearance: Normal appearance. She is well-developed.  Eyes:     General: No scleral icterus.    Conjunctiva/sclera: Conjunctivae normal.  Neck:     Trachea: No tracheal deviation.  Cardiovascular:     Rate and Rhythm: Normal rate.     Pulses: Normal pulses.  Pulmonary:     Effort: Pulmonary effort is normal. No respiratory distress.  Abdominal:     General: There is no distension.  Palpations: Abdomen is soft. There is no mass.     Tenderness: There is no abdominal tenderness.  Genitourinary:    Comments: No cva tenderness.  Musculoskeletal:        General: No swelling.     Cervical back: Neck supple. No muscular tenderness.     Comments: T/L/S spine non tender, aligned. Good passive rom right hip and knee without pain. RLE is of normal color and warmth, no swelling, intact distal pulses.   Skin:    General: Skin is warm and dry.     Findings: No rash.  Neurological:     Mental Status: She is alert.     Comments: Alert, speech normal. Motor/sens grossly intact. RLE nvi, with intact motor, stre 5/5 diffusely,  and sens grossly intact, reflexes 2+.  Steady gait.   Psychiatric:        Mood and Affect: Mood normal.    ED Results / Procedures / Treatments   Labs (all labs ordered are listed, but only abnormal results are displayed) Labs Reviewed - No data to display  EKG None  Radiology DG Lumbar Spine Complete Result Date: 12/07/2023 CLINICAL DATA:  Pain. Right hip pain radiating to leg. No known injury. EXAM: LUMBAR SPINE - COMPLETE 4+ VIEW COMPARISON:  None Available. FINDINGS: There are 5 nonrib-bearing lumbar vertebrae. There is exaggerated lumbar lordosis. No spondylolysis or spondylolisthesis. Vertebral body heights are maintained. No aggressive osseous lesion. Intervertebral disc heights are maintained. Mild L4-5 facet arthropathy and mild multilevel anterior marginal osteophyte formation. Sacroiliac joints are symmetric. Visualized soft tissues are within normal limits. IMPRESSION: *No acute osseous abnormality of the lumbar spine. Mild multilevel degenerative changes. Electronically Signed   By: Jules Schick M.D.   On: 12/07/2023 13:55    Procedures Procedures    Medications Ordered in ED Medications  predniSONE (DELTASONE) tablet 60 mg (60 mg Oral Given 12/07/23 1239)  acetaminophen (TYLENOL) tablet 1,000 mg (1,000 mg Oral Given 12/07/23 1239)    ED Course/ Medical Decision Making/ A&P                                 Medical Decision Making Problems Addressed: Acute right-sided low back pain with right-sided sciatica: acute illness or injury with systemic symptoms Degeneration of intervertebral disc of lumbar region with discogenic back pain: chronic illness or injury Elevated blood pressure reading: acute illness or injury Essential hypertension: chronic illness or injury with exacerbation, progression, or side effects of treatment that poses a threat to life or bodily functions  Amount and/or Complexity of Data Reviewed External Data Reviewed: notes. Radiology: ordered and  independent interpretation performed. Decision-making details documented in ED Course.  Risk OTC drugs. Prescription drug management.   Reviewed nursing notes and prior charts for additional history.   Pt drove self. Last took acetaminophen around 8 AM.  Prednisone po. Acetaminophen po.   Xrays reviewed/interpreted by me - no fx. Degen changes.   Pt appears stable for ED d/c.   Rec pcp f/u.  Rx for home.  Return precautions provided.          Final Clinical Impression(s) / ED Diagnoses Final diagnoses:  Acute right-sided low back pain with right-sided sciatica  Degeneration of intervertebral disc of lumbar region with discogenic back pain  Elevated blood pressure reading  Essential hypertension    Rx / DC Orders ED Discharge Orders     None  Cathren Laine, MD 12/07/23 [phone]

## 2024-07-13 ENCOUNTER — Other Ambulatory Visit: Payer: Self-pay

## 2024-07-13 ENCOUNTER — Emergency Department (HOSPITAL_BASED_OUTPATIENT_CLINIC_OR_DEPARTMENT_OTHER)
Admission: EM | Admit: 2024-07-13 | Discharge: 2024-07-13 | Disposition: A | Discharge status: Home / Self Care | Attending: Emergency Medicine | Admitting: Emergency Medicine

## 2024-07-13 ENCOUNTER — Encounter (HOSPITAL_BASED_OUTPATIENT_CLINIC_OR_DEPARTMENT_OTHER): Payer: Self-pay

## 2024-07-13 DIAGNOSIS — M5442 Lumbago with sciatica, left side: Secondary | ICD-10-CM | POA: Diagnosis not present

## 2024-07-13 DIAGNOSIS — N184 Chronic kidney disease, stage 4 (severe): Secondary | ICD-10-CM | POA: Insufficient documentation

## 2024-07-13 DIAGNOSIS — M545 Low back pain, unspecified: Secondary | ICD-10-CM | POA: Diagnosis present

## 2024-07-13 DIAGNOSIS — E1122 Type 2 diabetes mellitus with diabetic chronic kidney disease: Secondary | ICD-10-CM | POA: Diagnosis not present

## 2024-07-13 DIAGNOSIS — Z794 Long term (current) use of insulin: Secondary | ICD-10-CM | POA: Insufficient documentation

## 2024-07-13 MED ORDER — LIDOCAINE 5 % EX PTCH
1.0000 | MEDICATED_PATCH | CUTANEOUS | Status: DC
  Administered 2024-07-13: 1 via TRANSDERMAL
  Filled 2024-07-13: qty 1

## 2024-07-13 MED ORDER — LIDOCAINE 5 % EX PTCH: 1.0000 | MEDICATED_PATCH | CUTANEOUS | 0 refills | Status: AC

## 2024-07-13 MED ORDER — METHYLPREDNISOLONE 4 MG PO TBPK: ORAL_TABLET | ORAL | 0 refills | Status: AC

## 2024-07-13 NOTE — Discharge Instructions (Signed)
 Return to the emergency department if your symptoms worsen.  Continue Tylenol  and lidocaine patches as needed for pain.  Start Medrol Dosepak, use as directed on packaging.  Use of steroid medications may result in elevations in your blood sugar, please closely monitor this.  Follow-up with your primary care provider if your symptoms persist.

## 2024-07-13 NOTE — ED Triage Notes (Signed)
 Back pain with radiation down left leg x 5 days. Pain mainly in buttocks are and has difficulty sitting Denies injury. Pt has been taking Tylenol  without relief . Mild numbness and tingling

## 2024-07-13 NOTE — ED Provider Notes (Signed)
 [place] [facility] Provider Note   CSN: [id number] Arrival date & time: 07/13/24  1247     Patient presents with: Back Pain   Kiara Barrett is a 73 y.o. female.   73 year old female presenting with back/buttocks pain.  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  Reports pain that begins in her low back and radiates to her L buttocks/L thigh/L knee. Denies numbness/tingling. Has tried Tylenol  with minimal relief of her symptoms. History of sciatica in the opposite leg which was previously managed with Tylenol /tramadol . Denies bowel/bladder incontinence, saddle anesthesia, fever. History of stage IV CKD, T2DM.   Back Pain      Prior to Admission medications   Medication Sig Start Date End Date Taking? Authorizing Provider  amLODipine  (NORVASC ) 10 MG tablet Take 0.5 tablets (5 mg total) by mouth daily. 11/04/14   Levora Riggs, PA-C  atorvastatin (LIPITOR) 20 MG tablet Take 20 mg by mouth daily.    [provider]  carvedilol (COREG) 6.25 MG tablet Take 6.25 mg by mouth 2 (two) times daily with a meal.    [provider]  cephALEXin  (KEFLEX ) 500 MG capsule Take 1 capsule (500 mg total) by mouth 3 (three) times daily. 11/06/19   Loetta Senior, MD  diclofenac  sodium (VOLTAREN ) 1 % GEL Apply 2 g topically 4 (four) times daily. 06/18/18   Caccavale, Sophia, PA-C  hydrochlorothiazide (MICROZIDE) 12.5 MG capsule Take 12.5 mg by mouth daily.    [provider]  HYDROcodone -acetaminophen  (NORCO/VICODIN) 5-325 MG tablet Take 1 tablet by mouth every 6 (six) hours as needed. 02/01/21   Doretha Folks, MD  insulin aspart (NOVOLOG) 100 UNIT/ML injection Inject into the skin 3 (three) times daily before meals.    [provider]  insulin glargine (LANTUS) 100 UNIT/ML injection Inject 45 Units into the  skin 2 (two) times daily.    [provider]  predniSONE  (DELTASONE ) 20 MG tablet 3 po once a day for 2 days, then 2 po once a day for 3 days, then 1 po once a day for 3 days 12/08/23   Steinl, Kevin, MD  traMADol  (ULTRAM ) 50 MG tablet Take 1 tablet (50 mg total) by mouth every 8 (eight) hours as needed. 12/07/23   Bernard Drivers, MD    Allergies: Codeine    Review of Systems  Musculoskeletal:  Positive for back pain.    Updated Vital Signs  Vitals:   07/13/24 1254 07/13/24 1255  BP: (!) 135/120   Pulse: 62   Resp: 18   Temp: 98.2 F (36.8 C)   SpO2: 100%   Weight:  86.2 kg     Physical Exam Vitals and nursing note reviewed.  HENT:     Head: Normocephalic.  Eyes:     Extraocular Movements: Extraocular movements intact.  Cardiovascular:     Rate and Rhythm: Normal rate.     Pulses:          Dorsalis pedis pulses are 2+ on the right side and 2+ on the left side.  Pulmonary:     Effort: Pulmonary effort is normal.  Musculoskeletal:     Cervical back: Normal range of motion.     Right lower leg: No edema.     Left lower leg: No edema.     Comments: 5/5 strength against resistance of bilateral lower extremities No C-spine/T-spine TTP, mild TTP  in L-spine with associated L sided paralumbar muscle TTP, TTP in L buttocks/thigh  Skin:    General: Skin is warm and dry.  Neurological:     Mental Status: She is alert and oriented to person, place, and time.     Sensory: No sensory deficit.     Motor: No weakness.     (all labs ordered are listed, but only abnormal results are displayed) Labs Reviewed - No data to display  EKG: None  Radiology: No results found.   Procedures   Medications Ordered in the ED  lidocaine (LIDODERM) 5 % 1 patch (1 patch Transdermal Patch Applied 07/13/24 1426)                                    Medical Decision Making This patient presents to the ED for concern of back pain, this involves an extensive number of treatment  options, and is a complaint that carries with it a high risk of complications and morbidity.  The differential diagnosis includes sciatica, degenerative disc disease, other radiculopathy, lumbar strain, fracture   Co morbidities that complicate the patient evaluation  CKD, type 2 diabetes   Additional history obtained:  Additional history obtained from record review External records from outside source obtained and reviewed including most recent PCP note   Cardiac Monitoring: / EKG:  The patient was maintained on a cardiac monitor.  I personally viewed and interpreted the cardiac monitored which showed an underlying rhythm of: NSR    Problem List / ED Course / Critical interventions / Medication management  I ordered medication including lidocaine patch for pain Reevaluation of the patient after these medicines showed that the patient improved I have reviewed the patients home medicines and have made adjustments as needed   Social Determinants of Health:  Financial instability   Test / Admission - Considered:  Physical exam is notable as above, patient has 5/5 strength into resistance of bilateral lower extremities, no appreciable sensory deficit on exam, there is some mild tenderness to palpation in the lumbar/left paralumbar region but no appreciable deformity.  No red flag back pain signs/symptoms, no recent fall/injury, no indications for imaging at this time.  Symptoms are most consistent with sciatica, which patient has experienced previously on her right side.  Unfortunately due to CKD/diabetes pain management methods are limited, patient did endorse improvement in symptoms after placement of lidocaine patch.  Will prescribe additional lidocaine patches, recommend that she continue Tylenol  and start Medrol Dosepak for further alleviation of her symptoms.  I recommend that she discuss her symptoms with her primary care provider if they persist.  She voiced understanding and is  in agreement this plan.  Return precautions discussed, she is appropriate for discharge at this time.  Staffed with Dr. Dreama  Risk Prescription drug management.        Final diagnoses:  Left-sided low back pain with left-sided sciatica, unspecified chronicity    ED Discharge Orders          Ordered    lidocaine (LIDODERM) 5 %  Every 24 hours        07/13/24 1445    methylPREDNISolone (MEDROL DOSEPAK) 4 MG TBPK tablet        07/13/24 1445               Glendia Rocky SAILOR, PA-C 07/13/24 1741    Dreama Rocky, MD 07/14/24 1035

## 2024-11-19 ENCOUNTER — Other Ambulatory Visit: Payer: Self-pay

## 2024-11-19 ENCOUNTER — Emergency Department (HOSPITAL_BASED_OUTPATIENT_CLINIC_OR_DEPARTMENT_OTHER)

## 2024-11-19 ENCOUNTER — Encounter (HOSPITAL_BASED_OUTPATIENT_CLINIC_OR_DEPARTMENT_OTHER): Payer: Self-pay

## 2024-11-19 ENCOUNTER — Emergency Department (HOSPITAL_BASED_OUTPATIENT_CLINIC_OR_DEPARTMENT_OTHER)
Admission: EM | Admit: 2024-11-19 | Discharge: 2024-11-19 | Disposition: A | Discharge status: Home / Self Care | Attending: Emergency Medicine | Admitting: Emergency Medicine

## 2024-11-19 DIAGNOSIS — N189 Chronic kidney disease, unspecified: Secondary | ICD-10-CM | POA: Insufficient documentation

## 2024-11-19 DIAGNOSIS — I129 Hypertensive chronic kidney disease with stage 1 through stage 4 chronic kidney disease, or unspecified chronic kidney disease: Secondary | ICD-10-CM | POA: Insufficient documentation

## 2024-11-19 DIAGNOSIS — R238 Other skin changes: Secondary | ICD-10-CM | POA: Insufficient documentation

## 2024-11-19 DIAGNOSIS — E1122 Type 2 diabetes mellitus with diabetic chronic kidney disease: Secondary | ICD-10-CM | POA: Insufficient documentation

## 2024-11-19 DIAGNOSIS — Z79899 Other long term (current) drug therapy: Secondary | ICD-10-CM | POA: Insufficient documentation

## 2024-11-19 DIAGNOSIS — R209 Unspecified disturbances of skin sensation: Secondary | ICD-10-CM

## 2024-11-19 DIAGNOSIS — Z794 Long term (current) use of insulin: Secondary | ICD-10-CM | POA: Insufficient documentation

## 2024-11-19 LAB — BASIC METABOLIC PANEL WITH GFR
Anion gap: 10 (ref 5–15)
BUN: 39 mg/dL — ABNORMAL HIGH (ref 8–23)
CO2: 27 mmol/L (ref 22–32)
Calcium: 9.2 mg/dL (ref 8.9–10.3)
Chloride: 106 mmol/L (ref 98–111)
Creatinine, Ser: 3.35 mg/dL — ABNORMAL HIGH (ref 0.44–1.00)
GFR, Estimated: 14 mL/min — ABNORMAL LOW
Glucose, Bld: 171 mg/dL — ABNORMAL HIGH (ref 70–99)
Potassium: 4.2 mmol/L (ref 3.5–5.1)
Sodium: 143 mmol/L (ref 135–145)

## 2024-11-19 LAB — CBC
HCT: 34 % — ABNORMAL LOW (ref 36.0–46.0)
Hemoglobin: 10.8 g/dL — ABNORMAL LOW (ref 12.0–15.0)
MCH: 26.9 pg (ref 26.0–34.0)
MCHC: 31.8 g/dL (ref 30.0–36.0)
MCV: 84.8 fL (ref 80.0–100.0)
Platelets: 140 10*3/uL — ABNORMAL LOW (ref 150–400)
RBC: 4.01 MIL/uL (ref 3.87–5.11)
RDW: 13.5 % (ref 11.5–15.5)
WBC: 5.5 10*3/uL (ref 4.0–10.5)
nRBC: 0 % (ref 0.0–0.2)

## 2024-11-19 LAB — RESP PANEL BY RT-PCR (RSV, FLU A&B, COVID)  RVPGX2
Influenza A by PCR: NEGATIVE
Influenza B by PCR: NEGATIVE
Resp Syncytial Virus by PCR: NEGATIVE
SARS Coronavirus 2 by RT PCR: NEGATIVE

## 2024-11-19 LAB — PRO BRAIN NATRIURETIC PEPTIDE: Pro Brain Natriuretic Peptide: 562 pg/mL — ABNORMAL HIGH

## 2024-11-19 LAB — TROPONIN T, HIGH SENSITIVITY: Troponin T High Sensitivity: 25 ng/L — ABNORMAL HIGH (ref 0–19)

## 2024-11-19 NOTE — Discharge Instructions (Signed)
 Follow-up with your primary care doctor for further workup.  Your labs are unremarkable today.  You have good blood flow to your feet.  I am not quite sure why you are having this feelings in your feet but they may want to talk to you about different types of medications and further workup for this.

## 2024-11-19 NOTE — ED Provider Notes (Signed)
 " [facility] Provider Note   CSN: [id number] Arrival date & time: 11/19/24  2128     Patient presents with: Chills   Lessie Delossantos is a 74 y.o. female.   Patient is here with ongoing cold feet.  She has been having a hard time getting her feet to warm up here recently.  She does not really endorse any numbness maybe some tingling feeling.  She denies any chest pain shortness of breath.  Despite triage note she is not having any chest pain or shortness of breath.  She does not have any fever or chills.  She is mostly just having burning in her feet at times.  She is worried about her potassium being low.  She denies any black or bloody schools.  But her main concern is about her electrolytes.  The history is provided by the patient.       Prior to Admission medications  Medication Sig Start Date End Date Taking? Authorizing Provider  amLODipine  (NORVASC ) 10 MG tablet Take 0.5 tablets (5 mg total) by mouth daily. 11/04/14   Levora Riggs, PA-C  atorvastatin (LIPITOR) 20 MG tablet Take 20 mg by mouth daily.    [provider]  carvedilol (COREG) 6.25 MG tablet Take 6.25 mg by mouth 2 (two) times daily with a meal.    [provider]  cephALEXin  (KEFLEX ) 500 MG capsule Take 1 capsule (500 mg total) by mouth 3 (three) times daily. 11/06/19   Loetta Senior, MD  diclofenac  sodium (VOLTAREN ) 1 % GEL Apply 2 g topically 4 (four) times daily. 06/18/18   Caccavale, Sophia, PA-C  hydrochlorothiazide (MICROZIDE) 12.5 MG capsule Take 12.5 mg by mouth daily.    [provider]  HYDROcodone -acetaminophen  (NORCO/VICODIN) 5-325 MG tablet Take 1 tablet by mouth every 6 (six) hours as needed. 02/01/21   Doretha Folks, MD  insulin aspart (NOVOLOG) 100 UNIT/ML injection Inject into the skin 3 (three) times daily before meals.    [provider]  insulin glargine (LANTUS) 100 UNIT/ML injection Inject 45 Units into the skin 2  (two) times daily.    [provider]  lidocaine  (LIDODERM ) 5 % Place 1 patch onto the skin daily. Remove & Discard patch within 12 hours or as directed by MD 07/13/24   Glendia Rocky SAILOR, PA-C  methylPREDNISolone  (MEDROL  DOSEPAK) 4 MG TBPK tablet Use as directed on packaging 07/13/24   Glendia Rocky SAILOR, PA-C  traMADol  (ULTRAM ) 50 MG tablet Take 1 tablet (50 mg total) by mouth every 8 (eight) hours as needed. 12/07/23   Steinl, Kevin, MD    Allergies: Codeine    Review of Systems  Updated Vital Signs BP (!) 173/57 (BP Location: Right Arm)   Pulse 62   Temp 98.2 F (36.8 C) (Oral)   Resp 16   SpO2 100%   Physical Exam Vitals and nursing note reviewed.  Constitutional:      General: She is not in acute distress.    Appearance: She is well-developed. She is not ill-appearing.  HENT:     Head: Normocephalic and atraumatic.     Nose: Nose normal.     Mouth/Throat:     Mouth: Mucous membranes are moist.  Eyes:     Extraocular Movements: Extraocular movements intact.     Conjunctiva/sclera: Conjunctivae normal.     Pupils: Pupils are equal, round, and reactive to light.  Cardiovascular:     Rate and Rhythm: Normal rate and regular rhythm.  Pulses: Normal pulses.     Heart sounds: Normal heart sounds. No murmur heard. Pulmonary:     Effort: Pulmonary effort is normal. No respiratory distress.     Breath sounds: Normal breath sounds.  Abdominal:     Palpations: Abdomen is soft.     Tenderness: There is no abdominal tenderness.  Musculoskeletal:        General: No swelling or tenderness.     Cervical back: Neck supple.  Skin:    General: Skin is warm and dry.     Capillary Refill: Capillary refill takes less than 2 seconds.  Neurological:     General: No focal deficit present.     Mental Status: She is alert and oriented to person, place, and time.     Cranial Nerves: No cranial nerve deficit.     Sensory: No sensory deficit.     Motor: No weakness.     Coordination:  Coordination normal.  Psychiatric:        Mood and Affect: Mood normal.     (all labs ordered are listed, but only abnormal results are displayed) Labs Reviewed  BASIC METABOLIC PANEL WITH GFR - Abnormal; Notable for the following components:      Result Value   Glucose, Bld 171 (*)    BUN 39 (*)    Creatinine, Ser 3.35 (*)    GFR, Estimated 14 (*)    All other components within normal limits  CBC - Abnormal; Notable for the following components:   Hemoglobin 10.8 (*)    HCT 34.0 (*)    Platelets 140 (*)    All other components within normal limits  PRO BRAIN NATRIURETIC PEPTIDE - Abnormal; Notable for the following components:   Pro Brain Natriuretic Peptide 562.0 (*)    All other components within normal limits  TROPONIN T, HIGH SENSITIVITY - Abnormal; Notable for the following components:   Troponin T High Sensitivity 25 (*)    All other components within normal limits  RESP PANEL BY RT-PCR (RSV, FLU A&B, COVID)  [id number]    EKG: EKG Interpretation Date/Time:  Wednesday November 19 2024 21:49:05 EST Ventricular Rate:  60 PR Interval:  148 QRS Duration:  143 QT Interval:  447 QTC Calculation: 447 R Axis:   32  Text Interpretation: Sinus rhythm Right bundle branch block Confirmed by Ruthe Cornet [phone]) on 11/19/2024 9:53:14 PM  Radiology: [facility] Chest 2 View Result Date: 11/19/2024 CLINICAL DATA:  Shortness of breath EXAM: CHEST - 2 VIEW COMPARISON:  08/30/2020 FINDINGS: The heart size and mediastinal contours are within normal limits. Both lungs are clear. The visualized skeletal structures are unremarkable. IMPRESSION: No active cardiopulmonary disease. Electronically Signed   By: Ozell Daring M.D.   On: 11/19/2024 22:09     Procedures   Medications Ordered in the ED - No data to display                                  Medical Decision Making Amount and/or Complexity of Data Reviewed Labs: ordered. Radiology: ordered.   Sameera Passage is here with burning  coldness in her feet.  Unremarkable vitals.  No fever.  History of CKD.  She is mostly worried about her electrolytes.  She has history of diabetes hypertension high cholesterol.  She denies any chest pain or shortness of breath.  EKG in triage shows sinus rhythm.  No ischemic changes.  Despite triage note  she is not having any chest pain or shortness of breath.  Her main concern is about the tingling and coldness in her feet.  She has got palpable pulses on exam and these were confirmed with Doppler.  Not sure if this is some sort of neuropathy type process but will follow-up her labs.  She got CBC BMP troponin and BNP done in triage.  Chest x-ray shows no evidence of pneumonia or volume overload.  Clinically I do not see any signs of volume overload and EKG shows sinus rhythm with no ischemic changes.  Overall viral panel is negative.  proBNP is unremarkable.  Troponin 25.  I suspect that this is from her CKD.  Is not having any chest pain.  I do not think we need to repeat a troponin I do any other further cardiac workup.  I have no concern for clot or any other acute process.  Creatinine is at baseline at 3.35.  Her potassium is 4.2 and reassuring.  Overall recommend that she follow with her primary care doctor.  This could be some sort of neuropathy type process but at this time I do not think there is any emergent process patient discharged in good condition.  This is not appear to be any vascular process given exam and normal pulses.  This chart was dictated using voice recognition software.  Despite best efforts to proofread,  errors can occur which can change the documentation meaning.      Final diagnoses:  Cold feet    ED Discharge Orders     None          Ruthe Cornet, DO 11/19/24 2241  "

## 2024-11-19 NOTE — ED Triage Notes (Addendum)
 Pt presents via POV c/o coldness to body and feet over the last couple of days. Also reports some SOB. Ambulatory to triage. Denies Pain. Denies fevers.   Pt also states recently has been taking medication prescribed by nephrology for hyperkalemia. Pt does not know actual value.
